# Patient Record
Sex: Male | Born: 1980 | Race: White | Hispanic: No | Smoking: Current every day smoker
Health system: Southern US, Community
[De-identification: ages and names within clinical notes are randomized; demographics above are authoritative.]

## PROBLEM LIST (undated history)

## (undated) DIAGNOSIS — G8929 Other chronic pain: Secondary | ICD-10-CM

---
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
## 1998-05-16 ENCOUNTER — Emergency Department (HOSPITAL_COMMUNITY): Admission: EM | Admit: 1998-05-16 | Discharge: 1998-05-16 | Payer: Self-pay | Admitting: Emergency Medicine

## 1998-12-15 ENCOUNTER — Emergency Department (HOSPITAL_COMMUNITY): Admission: EM | Admit: 1998-12-15 | Discharge: 1998-12-15 | Payer: Self-pay | Admitting: Emergency Medicine

## 2001-03-21 ENCOUNTER — Emergency Department (HOSPITAL_COMMUNITY): Admission: EM | Admit: 2001-03-21 | Discharge: 2001-03-21 | Payer: Self-pay | Admitting: Emergency Medicine

## 2002-10-16 ENCOUNTER — Emergency Department (HOSPITAL_COMMUNITY): Admission: EM | Admit: 2002-10-16 | Discharge: 2002-10-16 | Payer: Self-pay | Admitting: Emergency Medicine

## 2003-05-23 ENCOUNTER — Emergency Department (HOSPITAL_COMMUNITY): Admission: EM | Admit: 2003-05-23 | Discharge: 2003-05-23 | Payer: Self-pay | Admitting: Emergency Medicine

## 2006-09-15 ENCOUNTER — Emergency Department (HOSPITAL_COMMUNITY): Admission: EM | Admit: 2006-09-15 | Discharge: 2006-09-15 | Payer: Self-pay | Admitting: Emergency Medicine

## 2007-05-23 ENCOUNTER — Emergency Department (HOSPITAL_COMMUNITY): Admission: EM | Admit: 2007-05-23 | Discharge: 2007-05-23 | Payer: Self-pay | Admitting: Emergency Medicine

## 2007-07-25 ENCOUNTER — Emergency Department (HOSPITAL_COMMUNITY): Admission: EM | Admit: 2007-07-25 | Discharge: 2007-07-25 | Payer: Self-pay | Admitting: Emergency Medicine

## 2007-09-02 ENCOUNTER — Emergency Department (HOSPITAL_COMMUNITY): Admission: EM | Admit: 2007-09-02 | Discharge: 2007-09-02 | Payer: Self-pay | Admitting: Emergency Medicine

## 2008-07-04 ENCOUNTER — Emergency Department (HOSPITAL_COMMUNITY): Admission: EM | Admit: 2008-07-04 | Discharge: 2008-07-04 | Payer: Self-pay | Admitting: Emergency Medicine

## 2012-05-24 ENCOUNTER — Ambulatory Visit: Payer: BC Managed Care – PPO

## 2012-05-24 ENCOUNTER — Ambulatory Visit (INDEPENDENT_AMBULATORY_CARE_PROVIDER_SITE_OTHER): Payer: BC Managed Care – PPO | Admitting: Family Medicine

## 2012-05-24 VITALS — BP 120/76 | HR 98 | Temp 98.1°F | Resp 16 | Ht 66.75 in | Wt 160.6 lb

## 2012-05-24 DIAGNOSIS — G609 Hereditary and idiopathic neuropathy, unspecified: Secondary | ICD-10-CM

## 2012-05-24 LAB — POCT CBC
HCT, POC: 45.2 % (ref 43.5–53.7)
Lymph, poc: 3.4 (ref 0.6–3.4)
MCH, POC: 31.9 pg — AB (ref 27–31.2)
MCHC: 31.6 g/dL — AB (ref 31.8–35.4)
MCV: 100.9 fL — AB (ref 80–97)
MID (cbc): 0.6 (ref 0–0.9)
POC LYMPH PERCENT: 35.9 %L (ref 10–50)
RDW, POC: 13.4 %

## 2012-05-24 LAB — GLUCOSE, POCT (MANUAL RESULT ENTRY): POC Glucose: 85 mg/dl (ref 70–99)

## 2012-05-24 MED ORDER — HYDROCODONE-ACETAMINOPHEN 5-325 MG PO TABS: 1.0000 | ORAL_TABLET | Freq: Four times a day (QID) | ORAL | Status: DC | PRN

## 2012-05-24 MED ORDER — METHYLPREDNISOLONE 4 MG PO KIT: PACK | ORAL | Status: DC

## 2012-05-24 NOTE — Progress Notes (Signed)
Subjective:    Patient ID: Cody Sims, male    DOB: Dec 01, 1980, 32 y.o.   MRN: [id number] Chief Complaint  Patient presents with  . Neck Pain    arms painful  numbness and tingling in arms and hands symptoms x 1 year    HPI  For the last yr L>Rt goes numb and tinging whenever he sleeps - but now the pain is so bad it wakes him up from sleep and now happening during the day at work. Now Left 1-3rd finger numb.  At times L lower humerus and - will hit suddently and hurt so bad he can't breath and hurts for an hr or 2.  Took a wk off of work, taking tons of ibupfrofen but now happens every single night and now during the day.  During the day is mainly hin his left arm - occ during day right arm will feel numb and tinging but L is excruciatingly painful. 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  Works in [employer]  History reviewed. No pertinent past medical history. History reviewed. No pertinent past surgical history. No family history on file. No current outpatient prescriptions on file prior to visit.   No current facility-administered medications on file prior to visit.   No Known Allergies History   Social History  . Marital Status: Married    Spouse Name: N/A    Number of Children: N/A  . Years of Education: N/A   Social History Main Topics  . Smoking status: Current Every Day Smoker  . Smokeless tobacco: None  . Alcohol Use: None  . Drug Use: None  . Sexually Active: None   Other  Topics Concern  . None   Social History Narrative  . None      Review of Systems  Constitutional: Negative for fever and chills.  HENT: Positive for neck pain and neck stiffness.   Eyes: Positive for visual disturbance.  Gastrointestinal: Negative for abdominal pain, diarrhea and constipation.  Genitourinary: Negative for urgency, frequency, decreased urine volume and difficulty urinating.  Musculoskeletal: Positive for myalgias, back pain and arthralgias. Negative for joint swelling and gait problem.  Skin: Negative for color change and wound.  Neurological: Positive for weakness, numbness and headaches. Negative for dizziness and light-headedness.  Psychiatric/Behavioral: Positive for sleep disturbance.      BP 120/76  Pulse 98  Temp(Src) 98.1 F (36.7 C) (Oral)  Resp 16  Ht 5' 6.75" (1.695 m)  Wt 160 lb 9.6 oz (72.848 kg)  BMI 25.36 kg/m2  SpO2 99% Objective:   Physical Exam  Constitutional: He is oriented to person, place, and time. He appears well-developed and well-nourished. No distress.  HENT:  Head: Normocephalic and atraumatic.  Eyes: Conjunctivae are normal. Pupils are equal, round, and reactive to light. No scleral icterus.  Neck: Normal range of motion. Neck  supple. No thyromegaly present.  Cardiovascular: Normal rate, regular rhythm, normal heart sounds and intact distal pulses.   Pulmonary/Chest: Effort normal and breath sounds normal. No respiratory distress.  Musculoskeletal: He exhibits no edema.       Cervical back: He exhibits decreased range of motion, tenderness, bony tenderness, pain and spasm. He exhibits no edema, no laceration and normal pulse.       Right hand: Normal. Normal sensation noted. Normal strength noted. He exhibits no finger abduction, no thumb/finger opposition and no wrist extension trouble.       Left hand: Normal. Normal sensation noted. Normal strength noted. He exhibits no finger abduction, no thumb/finger opposition and no wrist  extension trouble.  Lymphadenopathy:    He has no cervical adenopathy.  Neurological: He is alert and oriented to person, place, and time.  Reflex Scores:      Tricep reflexes are 2+ on the right side and 2+ on the left side.      Bicep reflexes are 2+ on the right side and 2+ on the left side.      Brachioradialis reflexes are 2+ on the right side and 2+ on the left side. Skin: Skin is warm and dry. He is not diaphoretic.  Psychiatric: He has a normal mood and affect. His behavior is normal.      Results for orders placed in visit on 05/24/12  POCT CBC      Result Value Range   WBC 9.5  4.6 - 10.2 K/uL   Lymph, poc 3.4  0.6 - 3.4   POC LYMPH PERCENT 35.9  10 - 50 %L   MID (cbc) 0.6  0 - 0.9   POC MID % 6.8  0 - 12 %M   POC Granulocyte 5.4  2 - 6.9   Granulocyte percent 57.3  37 - 80 %G   RBC 4.48 (*) 4.69 - 6.13 M/uL   Hemoglobin 14.3  14.1 - 18.1 g/dL   HCT, POC 16.1  09.6 - 53.7 %   MCV 100.9 (*) 80 - 97 fL   MCH, POC 31.9 (*) 27 - 31.2 pg   MCHC 31.6 (*) 31.8 - 35.4 g/dL   RDW, POC 04.5     Platelet Count, POC 285  142 - 424 K/uL   MPV 10.3  0 - 99.8 fL  GLUCOSE, POCT (MANUAL RESULT ENTRY)      Result Value Range   POC Glucose 85  70 - 99 mg/dl   [facility] reading (PRIMARY) by  Dr. Clelia Croft. Loss of some of the normal cervical lordosis, some mild disc narrowing between C5-6.  Assessment & Plan:  peripheral neuropathy - Plan: C-reactive protein, DG Cervical Spine Complete, POCT CBC, TSH, RPR, Comprehensive metabolic panel, POCT glucose (manual entry), Vitamin B12, Sedimentation Rate, Will do testing to check for alternate causes of his neuropathy though likely coming from his neck since DDD - which is suprising in such a young person.  He will check into his family history further to see if there could be a genetic rheumatologic component.  Certainly he may be prone to cervical pathology due to his left eye vision only leading to permenent twisting of his neck to aid in vision which  is leading to chronic neck muscle spasms.  Will try prednisone taper to see if that helps the radiculopathy.  Neck pain, chronic  Muscle spasms of neck  Meds ordered this encounter  Medications  . ibuprofen (ADVIL,MOTRIN) 200 MG tablet    Sig:  Take 200 mg by mouth every 6 (six) hours as needed for pain.  . Multiple Vitamin (MULTIVITAMIN) tablet    Sig: Take 1 tablet by mouth daily.  . methylPREDNISolone (MEDROL, PAK,) 4 MG tablet    Sig: follow package directions    Dispense:  21 tablet    Refill:  0  . HYDROcodone-acetaminophen (NORCO/VICODIN) 5-325 MG per tablet    Sig: Take 1 tablet by mouth every 6 (six) hours as needed for pain.    Dispense:  30 tablet    Refill:  0

## 2012-05-25 LAB — COMPREHENSIVE METABOLIC PANEL
ALT: 32 U/L (ref 0–53)
BUN: 13 mg/dL (ref 6–23)
CO2: 28 mEq/L (ref 19–32)
Calcium: 9.9 mg/dL (ref 8.4–10.5)
Chloride: 103 mEq/L (ref 96–112)
Creat: 0.95 mg/dL (ref 0.50–1.35)
Glucose, Bld: 92 mg/dL (ref 70–99)

## 2012-05-25 LAB — SEDIMENTATION RATE: Sed Rate: 1 mm/hr (ref 0–16)

## 2012-05-25 LAB — VITAMIN B12: Vitamin B-12: 1106 pg/mL — ABNORMAL HIGH (ref 211–911)

## 2012-05-25 LAB — C-REACTIVE PROTEIN: CRP: <0.5 mg/dL (ref <0.60)

## 2012-05-30 ENCOUNTER — Telehealth: Payer: Self-pay

## 2012-05-30 MED ORDER — TRAMADOL HCL 50 MG PO TABS: 50.0000 mg | ORAL_TABLET | Freq: Three times a day (TID) | ORAL | Status: DC | PRN

## 2012-05-30 NOTE — Telephone Encounter (Signed)
Pt advised new med sent in

## 2012-05-30 NOTE — Telephone Encounter (Signed)
PT STATES DR Clelia Croft WANTED HIM TO COME BACK FOR A RECHECK ON 3/12 BUT SHE IS OFF THAT DAY. WOULD LIKE A CALL BACK AT [phone]

## 2012-05-30 NOTE — Telephone Encounter (Signed)
Yes - the 13th will be perfect thanks.  Please send in tramadol 50mg  1 tab po q6hrs prn pain. Disp 30 no refills. Thanks.

## 2012-05-30 NOTE — Telephone Encounter (Signed)
He can come in on the 13th to urgent care center to see him. He asked if we can change the hydrocodone. He is using 2 at a time and it makes him sick he is vomiting he also states he takes one and this also makes him sick.

## 2012-06-01 ENCOUNTER — Ambulatory Visit (INDEPENDENT_AMBULATORY_CARE_PROVIDER_SITE_OTHER): Payer: BC Managed Care – PPO | Admitting: Family Medicine

## 2012-06-01 ENCOUNTER — Encounter: Payer: Self-pay | Admitting: Family Medicine

## 2012-06-01 VITALS — BP 110/68 | HR 60 | Temp 98.1°F | Resp 16 | Ht 67.25 in | Wt 163.2 lb

## 2012-06-01 DIAGNOSIS — M436 Torticollis: Secondary | ICD-10-CM

## 2012-06-01 DIAGNOSIS — M5010 Cervical disc disorder with radiculopathy, unspecified cervical region: Secondary | ICD-10-CM

## 2012-06-01 MED ORDER — OXYCODONE-ACETAMINOPHEN 5-325 MG PO TABS: 1.0000 | ORAL_TABLET | Freq: Three times a day (TID) | ORAL | Status: DC | PRN

## 2012-06-01 MED ORDER — CYCLOBENZAPRINE HCL 10 MG PO TABS: 10.0000 mg | ORAL_TABLET | Freq: Three times a day (TID) | ORAL | Status: DC | PRN

## 2012-06-01 NOTE — Progress Notes (Signed)
Subjective:    Patient ID: Cody Sims, male    DOB: 1980/11/25, 32 y.o.   MRN: [id number] Chief Complaint  Patient presents with  . Follow-up   HPI   Cody Sims initially improved when he was on high doses of the prednisone taper.  The pain reduced to where he could sleep through the night at least.  But as the prednisone tapered down it came back and now his shooting burning arm pain is worse than ever before.  Initially it was mainly his left arm and hand that were burning and tingling but now right arm and hand is hurting equally as much as left hand and his right hand is starting to go numb. He is noticing weakness, has now started dropping things, difficult to open jars, doorknobs, etc  He does notice some neck pain and stiffness but pales in comparison to the burning and numbness in his arms and hands.  Mother had cervical vertebral fusion and father has lumbar fusion twice and hand surgery.  They are followed by rheumatology but doesn't know any diagnosis beyond degenerative joint disease.  No past medical history on file. Current Outpatient Prescriptions on File Prior to Visit  Medication Sig Dispense Refill  . ibuprofen (ADVIL,MOTRIN) 200 MG tablet Take 200 mg by mouth every 6 (six) hours as needed for pain.      . Multiple Vitamin (MULTIVITAMIN) tablet Take 1 tablet by mouth daily.       No current facility-administered medications on file prior to visit.   No Known Allergies   Review of Systems  Constitutional: Negative for fever and chills.  HENT: Positive for neck pain and neck stiffness.   Gastrointestinal: Negative for abdominal pain, diarrhea and constipation.  Genitourinary: Negative for urgency, frequency, decreased urine volume and difficulty urinating.  Musculoskeletal: Positive for myalgias and back pain. Negative for joint swelling, arthralgias and gait problem.  Skin: Negative for color change and wound.  Neurological: Positive for weakness, numbness and headaches.  Negative for dizziness, tremors and light-headedness.  Psychiatric/Behavioral: Positive for sleep disturbance.      BP 110/68  Pulse 60  Temp(Src) 98.1 F (36.7 C) (Oral)  Resp 16  Ht 5' 7.25" (1.708 m)  Wt 163 lb 3.2 oz (74.027 kg)  BMI 25.38 kg/m2  SpO2 99% Objective:   Physical Exam  Constitutional: He is oriented to person, place, and time. He appears well-developed and well-nourished. No distress.  HENT:  Head: Normocephalic and atraumatic.  Cardiovascular: Intact distal pulses.   Pulmonary/Chest: Effort normal.  Musculoskeletal: Normal range of motion. He exhibits tenderness. He exhibits no edema.       Cervical back: He exhibits tenderness, pain and spasm. He exhibits normal range of motion, no bony tenderness, no edema and normal pulse.       Thoracic back: He exhibits normal range of motion, no bony tenderness, no swelling and no deformity.  Negative straight leg raise bilaterally  Neurological: He is alert and oriented to person, place, and time. He has normal strength. He displays no atrophy. No sensory deficit. He exhibits normal muscle tone. Coordination and gait normal.  Reflex Scores:      Bicep reflexes are 1+ on the right side and 1+ on the left side.      Brachioradialis reflexes are 1+ on the right side and 1+ on the left side. Skin: Skin is warm and dry. No rash noted. He is not diaphoretic. No erythema.  Psychiatric: He has a normal mood and  affect. His behavior is normal.      Results for orders placed in visit on 05/24/12  C-REACTIVE PROTEIN      Result Value Range   CRP <0.5  <0.60 mg/dL  TSH      Result Value Range   TSH 1.248  0.350 - 4.500 uIU/mL  RPR      Result Value Range   RPR NON REAC  NON REAC  COMPREHENSIVE METABOLIC PANEL      Result Value Range   Sodium 137  135 - 145 mEq/L   Potassium 4.1  3.5 - 5.3 mEq/L   Chloride 103  96 - 112 mEq/L   CO2 28  19 - 32 mEq/L   Glucose, Bld 92  70 - 99 mg/dL   BUN 13  6 - 23 mg/dL   Creat 1.61   0.96 - 0.45 mg/dL   Total Bilirubin 0.5  0.3 - 1.2 mg/dL   Alkaline Phosphatase 71  39 - 117 U/L   AST 29  0 - 37 U/L   ALT 32  0 - 53 U/L   Total Protein 7.5  6.0 - 8.3 g/dL   Albumin 4.7  3.5 - 5.2 g/dL   Calcium 9.9  8.4 - 40.9 mg/dL  VITAMIN W11      Result Value Range   Vitamin B-12 1106 (*) 211 - 911 pg/mL  SEDIMENTATION RATE      Result Value Range   Sed Rate 1  0 - 16 mm/hr  POCT CBC      Result Value Range   WBC 9.5  4.6 - 10.2 K/uL   Lymph, poc 3.4  0.6 - 3.4   POC LYMPH PERCENT 35.9  10 - 50 %L   MID (cbc) 0.6  0 - 0.9   POC MID % 6.8  0 - 12 %M   POC Granulocyte 5.4  2 - 6.9   Granulocyte percent 57.3  37 - 80 %G   RBC 4.48 (*) 4.69 - 6.13 M/uL   Hemoglobin 14.3  14.1 - 18.1 g/dL   HCT, POC 91.4  78.2 - 53.7 %   MCV 100.9 (*) 80 - 97 fL   MCH, POC 31.9 (*) 27 - 31.2 pg   MCHC 31.6 (*) 31.8 - 35.4 g/dL   RDW, POC 95.6     Platelet Count, POC 285  142 - 424 K/uL   MPV 10.3  0 - 99.8 fL  GLUCOSE, POCT (MANUAL RESULT ENTRY)      Result Value Range   POC Glucose 85  70 - 99 mg/dl    Assessment & Plan:  Acute torticollis - There is no doubt that pt has severe spasming of his left cervical paraspinal muscles due to the fact that he is monocular with left eye vision only.  He will definitely need PT.  However, due to his abnormal xray and sxs of neurologic impingement, he definitely needs a cspine MRI prior to starting PT to ensure that he does not need c-spine surgery. His degenerative disc disease at C5-6 is surprising at such a young age - and seems to be rapidly worsening with his progressive sxs of bilateral nerve impingement.  Cervicalgia  Degeneration of cervical intervertebral disc  Cervical disc disorder with radiculopathy of cervical region - Plan: MR Cervical Spine Wo Contrast  Meds ordered this encounter  Medications  . cyclobenzaprine (FLEXERIL) 10 MG tablet    Sig: Take 1 tablet (10 mg total) by mouth 3 (three) times  daily as needed for muscle  spasms.    Dispense:  30 tablet    Refill:  0  . oxyCODONE-acetaminophen (ROXICET) 5-325 MG per tablet    Sig: Take 1 tablet by mouth every 8 (eight) hours as needed for pain.    Dispense:  20 tablet    Refill:  0   RTC after MRI to review results and decide upon plan.

## 2012-06-01 NOTE — Patient Instructions (Signed)
Torticollis, Acute You have suddenly (acutely) developed a twisted neck (torticollis). This is usually a self-limited condition. CAUSES  Acute torticollis may be caused by malposition, trauma or infection. Most commonly, acute torticollis is caused by sleeping in an awkward position. Torticollis may also be caused by the flexion, extension or twisting of the neck muscles beyond their normal position. Sometimes, the exact cause may not be known. SYMPTOMS  Usually, there is pain and limited movement of the neck. Your neck may twist to one side. DIAGNOSIS  The diagnosis is often made by physical examination. X-rays, CT scans or MRIs may be done if there is a history of trauma or concern of infection. TREATMENT  For a common, stiff neck that develops during sleep, treatment is focused on relaxing the contracted neck muscle. Medications (including shots) may be used to treat the problem. Most cases resolve in several days. Torticollis usually responds to conservative physical therapy. If left untreated, the shortened and spastic neck muscle can cause deformities in the face and neck. Rarely, surgery is required. HOME CARE INSTRUCTIONS   Use over-the-counter and prescription medications as directed by your caregiver.  Do stretching exercises and massage the neck as directed by your caregiver.  Follow up with physical therapy if needed and as directed by your caregiver. SEEK IMMEDIATE MEDICAL CARE IF:   You develop difficulty breathing or noisy breathing (stridor).  You drool, develop trouble swallowing or have pain with swallowing.  You develop numbness or weakness in the hands or feet.  You have changes in speech or vision.  You have problems with urination or bowel movements.  You have difficulty walking.  You have a fever.  You have increased pain. MAKE SURE YOU:   Understand these instructions.  Will watch your condition.  Will get help right away if you are not doing well or  get worse. Document Released: 03/05/2000 Document Revised: 05/31/2011 Document Reviewed: 04/16/2009 [facility] Patient Information 2013 [place], [place].  Improving your posture may help reduce symptoms. Posture improvement includes pulling your chin and abdomen in while sitting or standing. If you are sitting, sit in a firm chair with your buttocks against the back of the chair. While sleeping, try replacing your pillow with a small towel rolled to 2 inches in diameter, or use a cervical pillow or soft cervical collar. Poor sleeping positions delay healing.  For patients with nerve root damage, which causes numbness or weakness, the use of a cervical traction apparatus may be recommended. Surgery is rarely necessary for these injuries. However, cervical strain and sprains that are present at birth (congenital) may require surgery. MEDICATION   If pain medication is necessary, nonsteroidal anti-inflammatory medications, such as aspirin and ibuprofen, or other minor pain relievers, such as acetaminophen, are often recommended.  Do not take pain medication for 7 days before surgery.  Prescription pain relievers may be given if deemed necessary by your caregiver. Use only as directed and only as much as you need. HEAT AND COLD:   Cold treatment (icing) relieves pain and reduces inflammation. Cold treatment should be applied for 10 to 15 minutes every 2 to 3 hours for inflammation and pain and immediately after any activity that aggravates your symptoms. Use ice packs or an ice massage.  Heat treatment may be used prior to performing the stretching and strengthening activities prescribed by your caregiver, physical therapist, or athletic trainer. Use a heat pack or a warm soak. SEEK MEDICAL CARE IF:   Symptoms get worse or do not  improve in 2 weeks despite treatment.  New, unexplained symptoms develop (drugs used in treatment may produce side effects). EXERCISES RANGE OF MOTION (ROM) AND STRETCHING  EXERCISES - Cervical Strain and Sprain These exercises may help you when beginning to rehabilitate your injury. In order to successfully resolve your symptoms, you must improve your posture. These exercises are designed to help reduce the forward-head and rounded-shoulder posture which contributes to this condition. Your symptoms may resolve with or without further involvement from your physician, physical therapist or athletic trainer. While completing these exercises, remember:   Restoring tissue flexibility helps normal motion to return to the joints. This allows healthier, less painful movement and activity.  An effective stretch should be held for at least 20 seconds, although you may need to begin with shorter hold times for comfort.  A stretch should never be painful. You should only feel a gentle lengthening or release in the stretched tissue. STRETCH- Axial Extensors  Lie on your back on the floor. You may bend your knees for comfort. Place a rolled up hand towel or dish towel, about 2 inches in diameter, under the part of your head that makes contact with the floor.  Gently tuck your chin, as if trying to make a "double chin," until you feel a gentle stretch at the base of your head.  Hold __________ seconds. Repeat __________ times. Complete this exercise __________ times per day.  STRETECH - Axial Extension   Stand or sit on a firm surface. Assume a good posture: chest up, shoulders drawn back, abdominal muscles slightly tense, knees unlocked (if standing) and feet hip width apart.  Slowly retract your chin so your head slides back and your chin slightly lowers.Continue to look straight ahead.  You should feel a gentle stretch in the back of your head. Be certain not to feel an aggressive stretch since this can cause headaches later.  Hold for __________ seconds. Repeat __________ times. Complete this exercise __________ times per day. STRETCH  Cervical Side Bend   Stand or  sit on a firm surface. Assume a good posture: chest up, shoulders drawn back, abdominal muscles slightly tense, knees unlocked (if standing) and feet hip width apart.  Without letting your nose or shoulders move, slowly tip your right / left ear to your shoulder until your feel a gentle stretch in the muscles on the opposite side of your neck.  Hold __________ seconds. Repeat __________ times. Complete this exercise __________ times per day. STRETCH  Cervical Rotators   Stand or sit on a firm surface. Assume a good posture: chest up, shoulders drawn back, abdominal muscles slightly tense, knees unlocked (if standing) and feet hip width apart.  Keeping your eyes level with the ground, slowly turn your head until you feel a gentle stretch along the back and opposite side of your neck.  Hold __________ seconds. Repeat __________ times. Complete this exercise __________ times per day. RANGE OF MOTION - Neck Circles   Stand or sit on a firm surface. Assume a good posture: chest up, shoulders drawn back, abdominal muscles slightly tense, knees unlocked (if standing) and feet hip width apart.  Gently roll your head down and around from the back of one shoulder to the back of the other. The motion should never be forced or painful.  Repeat the motion 10-20 times, or until you feel the neck muscles relax and loosen. Repeat __________ times. Complete the exercise __________ times per day. STRENGTHENING EXERCISES - Cervical Strain and Sprain These  exercises may help you when beginning to rehabilitate your injury. They may resolve your symptoms with or without further involvement from your physician, physical therapist or athletic trainer. While completing these exercises, remember:   Muscles can gain both the endurance and the strength needed for everyday activities through controlled exercises.  Complete these exercises as instructed by your physician, physical therapist or athletic trainer.  Progress the resistance and repetitions only as guided.  You may experience muscle soreness or fatigue, but the pain or discomfort you are trying to eliminate should never worsen during these exercises. If this pain does worsen, stop and make certain you are following the directions exactly. If the pain is still present after adjustments, discontinue the exercise until you can discuss the trouble with your clinician. STRENGTH Cervical Flexors, Isometric  Face a wall, standing about 6 inches away. Place a small pillow, a ball about 6-8 inches in diameter, or a folded towel between your forehead and the wall.  Slightly tuck your chin and gently push your forehead into the soft object. Push only with mild to moderate intensity, building up tension gradually. Keep your jaw and forehead relaxed.  Hold 10 to 20 seconds. Keep your breathing relaxed.  Release the tension slowly. Relax your neck muscles completely before you start the next repetition. Repeat __________ times. Complete this exercise __________ times per day. STRENGTH- Cervical Lateral Flexors, Isometric   Stand about 6 inches away from a wall. Place a small pillow, a ball about 6-8 inches in diameter, or a folded towel between the side of your head and the wall.  Slightly tuck your chin and gently tilt your head into the soft object. Push only with mild to moderate intensity, building up tension gradually. Keep your jaw and forehead relaxed.  Hold 10 to 20 seconds. Keep your breathing relaxed.  Release the tension slowly. Relax your neck muscles completely before you start the next repetition. Repeat __________ times. Complete this exercise __________ times per day. STRENGTH  Cervical Extensors, Isometric   Stand about 6 inches away from a wall. Place a small pillow, a ball about 6-8 inches in diameter, or a folded towel between the back of your head and the wall.  Slightly tuck your chin and gently tilt your head back into the soft  object. Push only with mild to moderate intensity, building up tension gradually. Keep your jaw and forehead relaxed.  Hold 10 to 20 seconds. Keep your breathing relaxed.  Release the tension slowly. Relax your neck muscles completely before you start the next repetition. Repeat __________ times. Complete this exercise __________ times per day. POSTURE AND BODY MECHANICS CONSIDERATIONS - Cervical Strain and Sprain Keeping correct posture when sitting, standing or completing your activities will reduce the stress put on different body tissues, allowing injured tissues a chance to heal and limiting painful experiences. The following are general guidelines for improved posture. Your physician or physical therapist will provide you with any instructions specific to your needs. While reading these guidelines, remember:  The exercises prescribed by your provider will help you have the flexibility and strength to maintain correct postures.  The correct posture provides the optimal environment for your joints to work. All of your joints have less wear and tear when properly supported by a spine with good posture. This means you will experience a healthier, less painful body.  Correct posture must be practiced with all of your activities, especially prolonged sitting and standing. Correct posture is as important when doing repetitive  low-stress activities (typing) as it is when doing a single heavy-load activity (lifting). PROLONGED STANDING WHILE SLIGHTLY LEANING FORWARD When completing a task that requires you to lean forward while standing in one place for a long time, place either foot up on a stationary 2-4 inch high object to help maintain the best posture. When both feet are on the ground, the low back tends to lose its slight inward curve. If this curve flattens (or becomes too large), then the back and your other joints will experience too much stress, fatigue more quickly and can cause pain.    RESTING POSITIONS Consider which positions are most painful for you when choosing a resting position. If you have pain with flexion-based activities (sitting, bending, stooping, squatting), choose a position that allows you to rest in a less flexed posture. You would want to avoid curling into a fetal position on your side. If your pain worsens with extension-based activities (prolonged standing, working overhead), avoid resting in an extended position such as sleeping on your stomach. Most people will find more comfort when they rest with their spine in a more neutral position, neither too rounded nor too arched. Lying on a non-sagging bed on your side with a pillow between your knees, or on your back with a pillow under your knees will often provide some relief. Keep in mind, being in any one position for a prolonged period of time, no matter how correct your posture, can still lead to stiffness. WALKING Walk with an upright posture. Your ears, shoulders and hips should all line-up. OFFICE WORK When working at a desk, create an environment that supports good, upright posture. Without extra support, muscles fatigue and lead to excessive strain on joints and other tissues. CHAIR:  A chair should be able to slide under your desk when your back makes contact with the back of the chair. This allows you to work closely.  The chair's height should allow your eyes to be level with the upper part of your monitor and your hands to be slightly lower than your elbows.  Body position:  Your feet should make contact with the floor. If this is not possible, use a foot rest.  Keep your ears over your shoulders. This will reduce stress on your neck and low back. Document Released: 03/08/2005 Document Revised: 05/31/2011 Document Reviewed: 06/20/2008 [facility] Patient Information 2013 [place], [place].

## 2012-06-05 ENCOUNTER — Encounter: Payer: Self-pay | Admitting: Family Medicine

## 2012-06-05 NOTE — Telephone Encounter (Signed)
Can you check on this pt referral for MRI

## 2012-06-06 ENCOUNTER — Telehealth: Payer: Self-pay | Admitting: Radiology

## 2012-06-06 ENCOUNTER — Telehealth: Payer: Self-pay

## 2012-06-06 DIAGNOSIS — M542 Cervicalgia: Secondary | ICD-10-CM

## 2012-06-06 NOTE — Telephone Encounter (Signed)
Called to get prior auth of MRI C spine Lanora Manis has done peer to peer

## 2012-06-06 NOTE — Telephone Encounter (Signed)
[employer] has done review and will not authorize the MRI C spine because office visit note is not complete there is no objective or physical exam.

## 2012-06-06 NOTE — Telephone Encounter (Signed)
PATIENT IS REQUESTING A REFILL ON HIS OXYCODONE. HE IS STILL WAITING TO BE CLEARED BY HIS INSURANCE TO SEE A SPECIALIST FOR HIS NECK. HE SAW DR. SHAW. BEST PHONE [phone] (CELL)   PHARMACY CHOICE IS [facility] ON [address].     [facility]

## 2012-06-08 ENCOUNTER — Other Ambulatory Visit: Payer: Self-pay | Admitting: Family Medicine

## 2012-06-08 MED ORDER — OXYCODONE-ACETAMINOPHEN 5-325 MG PO TABS: 1.0000 | ORAL_TABLET | Freq: Three times a day (TID) | ORAL | Status: DC | PRN

## 2012-06-08 NOTE — Telephone Encounter (Signed)
Sorry. Note done now.  Can order be resubmitted now or do I need to do a peer-to-peer again?  If so, will be happy to do before 4 today.

## 2012-06-08 NOTE — Telephone Encounter (Signed)
rx printed - hold for sig. Thanks.

## 2012-06-08 NOTE — Telephone Encounter (Signed)
MRI C spine reordered- [facility]. Oxycodone printed at [facility]. Notify pt when signed.

## 2012-06-08 NOTE — Telephone Encounter (Signed)
He is also advised we will send MRI back through insurance for auth.

## 2012-06-08 NOTE — Telephone Encounter (Signed)
Called patient to advise  °

## 2012-06-08 NOTE — Addendum Note (Signed)
Addended by: Elease Etienne A on: 06/08/2012 02:28 PM   Modules accepted: Orders

## 2012-06-08 NOTE — Telephone Encounter (Signed)
Yes, that is fine. He can pick up the rx tomorrow.  This is only a short term med and can quickly become tolerant so try to use sparingly.  We can repeat the prednisone burst if he need to.

## 2012-06-14 ENCOUNTER — Other Ambulatory Visit: Payer: Self-pay | Admitting: Family Medicine

## 2012-06-14 ENCOUNTER — Telehealth: Payer: Self-pay | Admitting: Radiology

## 2012-06-14 DIAGNOSIS — Z9889 Other specified postprocedural states: Secondary | ICD-10-CM

## 2012-06-14 NOTE — Telephone Encounter (Signed)
Orbits ordered to r/o metal prior to MRI Scan

## 2012-06-17 ENCOUNTER — Other Ambulatory Visit: Payer: Self-pay

## 2012-06-19 ENCOUNTER — Telehealth: Payer: Self-pay

## 2012-06-19 MED ORDER — OXYCODONE-ACETAMINOPHEN 5-325 MG PO TABS: 1.0000 | ORAL_TABLET | Freq: Three times a day (TID) | ORAL | Status: DC | PRN

## 2012-06-19 NOTE — Telephone Encounter (Signed)
Patient is out of his medications and does not have mri until tomorrow he would like dr Clelia Croft to call him to see if he can get refills on his pain meds until the results come in from his mri please call him at [phone]

## 2012-06-19 NOTE — Telephone Encounter (Signed)
Please advise, it did take a long time to get precert from [employer]< but I finally did get this the end of last week. Pended refill on Oxycodone.

## 2012-06-19 NOTE — Telephone Encounter (Signed)
Yes, that is fine to refill for 30 tabs.

## 2012-06-19 NOTE — Telephone Encounter (Signed)
Patient advised it is ready for pick up 

## 2012-06-20 ENCOUNTER — Ambulatory Visit
Admission: RE | Admit: 2012-06-20 | Discharge: 2012-06-20 | Disposition: A | Discharge status: Home / Self Care | Payer: BC Managed Care – PPO | Source: Ambulatory Visit | Attending: Family Medicine | Admitting: Family Medicine

## 2012-06-20 DIAGNOSIS — M542 Cervicalgia: Secondary | ICD-10-CM

## 2012-06-20 DIAGNOSIS — Z9889 Other specified postprocedural states: Secondary | ICD-10-CM

## 2012-06-21 ENCOUNTER — Other Ambulatory Visit: Payer: Self-pay | Admitting: Family Medicine

## 2012-06-21 ENCOUNTER — Telehealth: Payer: Self-pay | Admitting: Physician Assistant

## 2012-06-21 DIAGNOSIS — M5030 Other cervical disc degeneration, unspecified cervical region: Secondary | ICD-10-CM

## 2012-06-21 DIAGNOSIS — M4712 Other spondylosis with myelopathy, cervical region: Secondary | ICD-10-CM

## 2012-06-21 MED ORDER — OXYCODONE HCL 5 MG PO TABS: 5.0000 mg | ORAL_TABLET | ORAL | Status: DC | PRN

## 2012-06-21 MED ORDER — PREDNISONE 20 MG PO TABS: ORAL_TABLET | ORAL | Status: DC

## 2012-06-21 NOTE — Progress Notes (Signed)
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  He is getting worse. Is having to take 3 tabs of the percocet at a time to get any relief.  Would like to repeat the steroid as it did help at the higher doses and I will refill his oxycodone but w/o the tylenol per his request.  Urgent referral to neurosurgery as this will likely need decompression and he is worsening.

## 2012-06-21 NOTE — Telephone Encounter (Signed)
Per Dr. Clelia Croft, ok to re-print rx as it did not print earlier.  Pt here for rx pick up

## 2012-06-28 ENCOUNTER — Encounter: Payer: Self-pay | Admitting: Family Medicine

## 2012-07-03 ENCOUNTER — Telehealth: Payer: Self-pay

## 2012-07-03 DIAGNOSIS — M4802 Spinal stenosis, cervical region: Secondary | ICD-10-CM

## 2012-07-03 NOTE — Telephone Encounter (Signed)
Patient called back to see if Oxycodone refilled. Explained message sent for approval. He is requesting response today. Please advise. See previous message

## 2012-07-03 NOTE — Telephone Encounter (Signed)
Called patient. He states he has seen neurosurgeon had nerve study. 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 Dr Phoebe Perch at [facility] will be on vacation until the end of the month. He is asking for referral to [facility], to another Neurosurgeon. He states he did not like Dr Phoebe Perch at all, and is unhappy about what he has read on the internet. I advised him we will let him know if we get the results from his nerve study. He also would like to know if we can renew his Oxycodone.

## 2012-07-03 NOTE — Telephone Encounter (Signed)
Pt is calling back to see if his medication is ready. I checked in pick up drawer and did not see it.

## 2012-07-03 NOTE — Telephone Encounter (Signed)
Pt would like dr Clelia Croft to call him regarding his medications please call at [phone]

## 2012-07-04 ENCOUNTER — Other Ambulatory Visit: Payer: Self-pay | Admitting: Family Medicine

## 2012-07-04 MED ORDER — OXYCODONE HCL 5 MG PO TABS: 5.0000 mg | ORAL_TABLET | ORAL | Status: DC | PRN

## 2012-07-04 NOTE — Telephone Encounter (Signed)
Where did this print? Can not find.

## 2012-07-04 NOTE — Telephone Encounter (Signed)
Med refilled and ready for pt's pick up.

## 2012-07-04 NOTE — Telephone Encounter (Signed)
Please remind pt that oxycodone is a highly controlled medication, no one else can approve it other than me, and if I am not in clinic for several days or busy seeing pt's it could take 48-72 hrs as normally this medication is ONLY refilled at [facility].  We will have to wean pt off soon before he becomes dependent so hopefully he can get into [facility] soon. Thank you for placing that referral and I will look for the notes from [place].

## 2012-07-04 NOTE — Telephone Encounter (Signed)
You are to fast for me!

## 2012-07-04 NOTE — Telephone Encounter (Signed)
Left message notified patient rx ready for p/u.

## 2012-07-04 NOTE — Telephone Encounter (Signed)
3rd call

## 2012-07-14 ENCOUNTER — Telehealth: Payer: Self-pay

## 2012-07-14 NOTE — Telephone Encounter (Signed)
Pt is wanting Dr. Clelia Croft to give him a call Call back number is [phone]

## 2012-07-14 NOTE — Telephone Encounter (Signed)
Patient calling for nerve study results and if Dr. Clelia Croft can refill his medication- Oxycodone.   [facility] [place]. Phone: [phone].

## 2012-07-14 NOTE — Telephone Encounter (Signed)
Called patient, he is asking if you have gotten results of nerve study he had done at neurosurgeon office, was told he needs surgery for nerves on his hand. He states the new pain meds are helping he states he needs refill next week. Wants to pick up today.

## 2012-07-14 NOTE — Telephone Encounter (Signed)
Pt is calling back to speak to Amy, Call back number is [phone]

## 2012-07-15 ENCOUNTER — Other Ambulatory Visit: Payer: Self-pay | Admitting: Family Medicine

## 2012-07-15 MED ORDER — OXYCODONE HCL 5 MG PO TABS: 5.0000 mg | ORAL_TABLET | ORAL | Status: DC | PRN

## 2012-07-15 NOTE — Telephone Encounter (Signed)
No, I have not seen the results yet and might not even know how to interpret them beyond reading the summary that the neurosurgeon wrote.  If they recommend surgery, then this is what he needs.  I had gave him a 2 wk supply of pain meds at last time, so I will refill and he can pick it up today but can't fill till Tues.  As we discussed in the past, the pain meds are going to become less effective the longer he is on them so needs to get sched w/ neurosurg asap.  Does have have an appt at [facility] yet?

## 2012-07-15 NOTE — Telephone Encounter (Signed)
Patient notified and voiced understanding. He does not have appt at [facility] yet. Can you print Oxycodone RX for patient to pick up?

## 2012-07-27 ENCOUNTER — Telehealth: Payer: Self-pay

## 2012-07-27 NOTE — Telephone Encounter (Signed)
PATIENT NEEDS TO PICK UP A COPY OF HIS X-RAYS TO TAKE OVER TO HIS NEUROLOGIST. PATIENT ALSO WANTS TO PICK UP AN OXYCODONE RX. [phone].

## 2012-07-27 NOTE — Telephone Encounter (Signed)
Request for xrays to xray/ Oxycodone request to Dr Clelia Croft

## 2012-07-28 ENCOUNTER — Other Ambulatory Visit: Payer: Self-pay | Admitting: Family Medicine

## 2012-07-28 ENCOUNTER — Telehealth: Payer: Self-pay

## 2012-07-28 NOTE — Telephone Encounter (Signed)
I saw he was requesting xrays?  Does this mean he finally has his neurosurg second opinion appt at [facility]?  I finally got the info from [facility] and I want to ensure he is able to get surgery before he has permanent nerve damage.  Can pick up oxycodone rx tomorrow but pt reminded to ensure he is only using ONE pharmacy to fill all pain med scripts.  Also, if he is thinking he is going to be on this medication for much longer, he should sched a OV w/ me at 104 so we can discuss and work out a more convenient and standardized prescribing as he is clearly needing more and more pain medicine.  He needs to give me at least 72 hrs for controlled med refills.

## 2012-07-28 NOTE — Telephone Encounter (Signed)
Patient calling again for Rx refill oxycodone 5 mg from Dr.Shaw.    To pickup Rx if possible.   Call:  [phone]

## 2012-07-29 MED ORDER — OXYCODONE HCL 5 MG PO TABS: 5.0000 mg | ORAL_TABLET | Freq: Four times a day (QID) | ORAL | Status: DC | PRN

## 2012-07-29 NOTE — Telephone Encounter (Signed)
Patient notified and voiced understanding. He does have an appt 08/16/12. He is going to call and schedule appt with Dr. Clelia Croft

## 2012-08-11 ENCOUNTER — Ambulatory Visit (INDEPENDENT_AMBULATORY_CARE_PROVIDER_SITE_OTHER): Payer: BC Managed Care – PPO | Admitting: Family Medicine

## 2012-08-11 ENCOUNTER — Encounter: Payer: Self-pay | Admitting: Family Medicine

## 2012-08-11 VITALS — BP 118/62 | HR 95 | Temp 98.4°F | Resp 16 | Ht 67.0 in | Wt 148.8 lb

## 2012-08-11 DIAGNOSIS — M5030 Other cervical disc degeneration, unspecified cervical region: Secondary | ICD-10-CM

## 2012-08-11 DIAGNOSIS — G894 Chronic pain syndrome: Secondary | ICD-10-CM | POA: Insufficient documentation

## 2012-08-11 DIAGNOSIS — G5600 Carpal tunnel syndrome, unspecified upper limb: Secondary | ICD-10-CM | POA: Insufficient documentation

## 2012-08-11 DIAGNOSIS — Z79899 Other long term (current) drug therapy: Secondary | ICD-10-CM | POA: Insufficient documentation

## 2012-08-11 DIAGNOSIS — G5602 Carpal tunnel syndrome, left upper limb: Secondary | ICD-10-CM

## 2012-08-11 MED ORDER — DULOXETINE HCL 30 MG PO CPEP: 30.0000 mg | ORAL_CAPSULE | Freq: Two times a day (BID) | ORAL | Status: DC

## 2012-08-11 MED ORDER — DICLOFENAC SODIUM 75 MG PO TBEC: 75.0000 mg | DELAYED_RELEASE_TABLET | Freq: Three times a day (TID) | ORAL | Status: DC

## 2012-08-11 MED ORDER — PREDNISONE 20 MG PO TABS: ORAL_TABLET | ORAL | Status: DC

## 2012-08-11 MED ORDER — DULOXETINE HCL 60 MG PO CPEP: 60.0000 mg | ORAL_CAPSULE | Freq: Every day | ORAL | Status: DC

## 2012-08-11 MED ORDER — GABAPENTIN 300 MG PO CAPS: 300.0000 mg | ORAL_CAPSULE | Freq: Two times a day (BID) | ORAL | Status: DC

## 2012-08-11 MED ORDER — OXYCODONE HCL 10 MG PO TABS: 10.0000 mg | ORAL_TABLET | Freq: Three times a day (TID) | ORAL | Status: DC | PRN

## 2012-08-11 MED ORDER — OXYCODONE HCL 10 MG PO TABS: 10.0000 mg | ORAL_TABLET | Freq: Three times a day (TID) | ORAL | Status: AC | PRN

## 2012-08-11 NOTE — Progress Notes (Signed)
Subjective:    Patient ID: Cody Sims, male    DOB: [birth year], 32 y.o.   MRN: [id number] Chief Complaint  Patient presents with  . Medication Refill  . RESULTS ON HANDS - NEUROSURGEON    HPI    [name] is arranged for a second opinion from the [facility] appt in [place].  He was reluctant to stick w/ [facility] as he did not see good things on the web and then his appt time got pushed back and no one told him so had a 2 hr wait. Was set up for the emg/ncv the next d and then never got the results and he was told that he wouldn't hear about them for a month as the neurosurgeon was on vacation for 3 wks - after told him it needed to be f/u asap.  Just scared about the different things he had heard.  Mother had anterior cervical fusion, his family is on chronic pain medication. Oxycodone 5mg  works but only if he takes 2-3 at once - about twice a day - still waking up during the night in pain. Still has flexeril but doesn't help - can help sleep at night more Prednisone worked but only for a few days when on the highest dose. Has some diclofenac which helps if he takes with oxycodone. Was thinking about starting on an antidpressant.  Used to be on prozac and alprazolam.  Family has been on cymbalta and he is interested in trying that.  Is often down because he hurts so much.   Noticing weakness in hands - esp left.  No past medical history on file. Current Outpatient Prescriptions on File Prior to Visit  Medication Sig Dispense Refill  . Multiple Vitamin (MULTIVITAMIN) tablet Take 1 tablet by mouth daily.       No current facility-administered medications on file prior to visit.   No Known Allergies No past surgical history on file. No family history on file. History   Social History  . Marital Status: Married    Spouse Name: N/A    Number of Children: N/A  . Years of Education: N/A   Social History Main Topics  . Smoking status: Current Every Day Smoker  . Smokeless tobacco:  None  . Alcohol Use: None  . Drug Use: None  . Sexually Active: None   Other Topics Concern  . None   Social History Narrative  . None      Review of Systems  Constitutional: Negative for fever and chills.  Gastrointestinal: Negative for abdominal pain, diarrhea and constipation.  Genitourinary: Negative for urgency, frequency, decreased urine volume and difficulty urinating.  Musculoskeletal: Positive for myalgias, back pain and arthralgias. Negative for joint swelling and gait problem.  Skin: Negative for color change and wound.  Neurological: Positive for facial asymmetry, weakness, numbness and headaches. Negative for dizziness and light-headedness.  Psychiatric/Behavioral: Positive for sleep disturbance.      BP 118/62  Pulse 95  Temp(Src) 98.4 F (36.9 C) (Oral)  Resp 16  Ht 5\' 7"  (1.702 m)  Wt 148 lb 12.8 oz (67.495 kg)  BMI 23.3 kg/m2  SpO2 99% Objective:   Physical Exam  Constitutional: He is oriented to person, place, and time. He appears well-developed and well-nourished. No distress.  HENT:  Head: Normocephalic and atraumatic.  Eyes: Conjunctivae are normal. Pupils are equal, round, and reactive to light. No scleral icterus.  Neck: Normal range of motion. Neck supple. No thyromegaly present.  Cardiovascular: Normal rate, regular rhythm,  normal heart sounds and intact distal pulses.   Pulmonary/Chest: Effort normal and breath sounds normal. No respiratory distress.  Musculoskeletal: He exhibits no edema.  Lymphadenopathy:    He has no cervical adenopathy.  Neurological: He is alert and oriented to person, place, and time.  Skin: Skin is warm and dry. He is not diaphoretic.  Psychiatric: He has a normal mood and affect. His behavior is normal.          Assessment & Plan:  Chronic pain syndrome - spent >25 min discussing chronic pain, it's management, opoid use, sequelae, and likely course with pt and his wife.  Since pt does not yet have surgery sched,  will start him on chronic narcotic protocol and reviewed [facility] policy - gave 2 rx for oxycodone 10mg  tid #90 so no refilled needed until 10/09/12 and he is not to receive narcotic rx from any one else.  He has appt sched w/ me on 718 for further refills as long as UDS is done at that time and [place] [facility] review is appropriate. Pt understands and is agreeable to plan.  Start cymbalta 30 mg qd x 1 wk then increase to 60 qd as long as tolerating, cont diclofenac. Start neurontin 300 qhs and increase to bid in 2 wks.  DDD (degenerative disc disease), cervical - further eval w/ neurosurgery after CTS repair. Pt's sxs did sig improve on high dose prednisone so gave printed snap rx for prednisone taper to fill in case his pain worsens.  Carpal tunnel syndrome, left - Followup with 2nd opinion neurosurg sched - reviewed w/ pt that surgery will be to spare function and cannot expect to have all pain relieved - but hopefully enough that he can go off narcotics.  Encounter for long-term (current) use of other medications  Meds ordered this encounter  Medications  . DISCONTD: oxyCODONE (OXY IR/ROXICODONE) 5 MG immediate release tablet    Sig: Take 5 mg by mouth every 6 (six) hours as needed for pain. Needs OV before refills.  NEED REFILLS  . oxyCODONE 10 MG TABS    Sig: Take 1 tablet (10 mg total) by mouth every 8 (eight) hours as needed for pain.    Dispense:  90 tablet    Refill:  0  . Oxycodone HCl 10 MG TABS    Sig: Take 1 tablet (10 mg total) by mouth every 8 (eight) hours as needed. May fill on or after 08/09/12    Dispense:  90 tablet    Refill:  0  . gabapentin (NEURONTIN) 300 MG capsule    Sig: Take 1 capsule (300 mg total) by mouth 2 (two) times daily.    Dispense:  60 capsule    Refill:  1  . DULoxetine (CYMBALTA) 30 MG capsule    Sig: Take 1 capsule (30 mg total) by mouth 2 (two) times daily.    Dispense:  60 capsule    Refill:  0  . DULoxetine (CYMBALTA) 60 MG capsule    Sig: Take 1 capsule (60  mg total) by mouth daily.    Dispense:  30 capsule    Refill:  0  . predniSONE (DELTASONE) 20 MG tablet    Sig: Take 4 tabs daily x 4d, then 3 tabs daily x 4d, then 2 tabs daily x4d, then 1 tabs daily x 4d, then 1/2 tab daily x 4d.    Dispense:  42 tablet    Refill:  0  . diclofenac (VOLTAREN) 75 MG EC tablet  Sig: Take 1 tablet (75 mg total) by mouth 3 (three) times daily.    Dispense:  90 tablet    Refill:  1

## 2012-08-11 NOTE — Patient Instructions (Addendum)
Start on cymbalta 30mg  every morning. If you are doing ok on it in 1 week, go up to 2 pills in the morning. The next month, fill the prescription for the 60mg  tabs instead so that you are just taking 1 every morning.  If it is making you fatigued and you need to switch to evening dosing, that is fine but try it in the morning first as hopefully it will help more with pain and energy. If you are having side effects (nausea, lightheadedness) and need to continue on the 30mg  tab for several weeks before going up to 60mg  - that is completely fine - listen to your body.  Take the gabapentin at night only for 2 weeks. If after 2 weeks you are doing ok (not to much hang over grogginess in the morning) then go up to twice a day on it.  Take the diclofenac three times a day with the oxycodone as needed (no more than 3 times a day.) Do NOT taking any other otc pain medications other than tylenol or acetaminophen (no ibuprofen, aleve, advil, goody's, Excedrin, etc)  [facility] Policy for Prescribing Controlled Substances (Revised 01/2012) 1. Prescriptions for controlled substances will be filled by ONE provider at [facility] with whom you have established and developed a plan for your care, including follow-up. 2. You are encouraged to schedule an appointment with your prescriber at our appointment center for follow-up visits whenever possible. 3. If you request a prescription for the controlled substance while at [facility] for an acute problem (with someone other than your regular prescriber), you MAY be given a ONE-TIME prescription for a 30-day supply of the controlled substance, to allow time for you to return to see your regular prescriber for additional prescriptions. 4. Follow up with me on July 18th - schedule an appointment, for additional med refills. We will do a urine drug screen at that time. If something happens and you are in more pain, try the prednisone taper. If for some reason you have to get pain mdicine from a  different provider, make sure you call and let me know why and who before you fill it.

## 2012-08-16 ENCOUNTER — Encounter: Payer: Self-pay | Admitting: Family Medicine

## 2012-09-11 ENCOUNTER — Telehealth: Payer: Self-pay

## 2012-09-11 NOTE — Telephone Encounter (Signed)
PT STATES HE NEED AN OOW NOTE FROM DR Clelia Croft FOR [phone] AND THE 19TH. HIS JOB WAS HECTIC. PLEASE CALL [phone] WHEN READY FOR PICK UP

## 2012-09-12 NOTE — Telephone Encounter (Signed)
PT IS EXTREMELY UPSET THAT THIS IS HIS THIRD CALL HERE. PT NEEDS SOMEONE TO CALL HIM TODAY, HE NEEDS A NOTE TO RETURN TO WORK.  PLEASE CALL PT TO ADVISE

## 2012-09-12 NOTE — Telephone Encounter (Signed)
PT CALLED AGAIN AND STATED HE CANNOT GO BACK TO WORK WITHOUT A DR'S NOTE SO IS REALLY IMPORTANT HE HEAR SOMETHING TODAY IF POSSIBLE PLEASE CALL [phone]

## 2012-09-13 ENCOUNTER — Telehealth: Payer: Self-pay

## 2012-09-13 NOTE — Telephone Encounter (Signed)
Left message for him to call me back so I can advise.

## 2012-09-13 NOTE — Telephone Encounter (Signed)
He is advised. 

## 2012-09-13 NOTE — Telephone Encounter (Signed)
Called again to different number he has provided

## 2012-09-13 NOTE — Telephone Encounter (Signed)
He has not been seen for any illness since 5/23. We cannot write him out of work for anything that we have not evaluated him for.

## 2012-09-13 NOTE — Telephone Encounter (Signed)
Amy please call patient back at [phone]

## 2012-09-28 ENCOUNTER — Other Ambulatory Visit: Payer: Self-pay | Admitting: Family Medicine

## 2012-09-28 ENCOUNTER — Telehealth: Payer: Self-pay

## 2012-09-28 DIAGNOSIS — G5600 Carpal tunnel syndrome, unspecified upper limb: Secondary | ICD-10-CM

## 2012-09-28 DIAGNOSIS — G894 Chronic pain syndrome: Secondary | ICD-10-CM

## 2012-09-28 DIAGNOSIS — M5030 Other cervical disc degeneration, unspecified cervical region: Secondary | ICD-10-CM

## 2012-09-28 NOTE — Telephone Encounter (Signed)
Pt calling for status on his earlier message. Will see if clinical can speak with pt. bf

## 2012-09-28 NOTE — Telephone Encounter (Signed)
Pt was given a 2 month supply of oxycodone on 5/23 so it should last him until 7/21 at least.  If he is out 2 weeks early - he is using MUCH more than prescribed and I cannot safely prescribe such high doses of medication.  Therefore, it would be best to refer him to a pain management doctor who can work with him to get his pain under better control and is in a safer environment to use these high levels of medication.  Referral to pain management placed.  In the meantime, he should try the rx for prednisone that he was given at our last visit and he is likely going to have w/d sxs from the narcotics.

## 2012-09-28 NOTE — Telephone Encounter (Signed)
Pt would like a refill on oxycodone. He states that he is completely out. Best# [phone]

## 2012-09-28 NOTE — Telephone Encounter (Signed)
Pt states that he has been taking oxycodone every 4-6 hours.  I advised him he was to be taking it only 3 times a day.  I explained note in regards to pain management and taking prednisone but he stated he will come in to be seen on Saturday.  Just FYI.

## 2012-09-30 NOTE — Telephone Encounter (Signed)
Pt ask that Dr Clelia Croft give him a call back @ [phone] to discuss meds

## 2012-10-01 ENCOUNTER — Other Ambulatory Visit: Payer: Self-pay | Admitting: Family Medicine

## 2012-10-01 DIAGNOSIS — G894 Chronic pain syndrome: Secondary | ICD-10-CM

## 2012-10-01 MED ORDER — OXYCODONE HCL 10 MG PO TABS: 10.0000 mg | ORAL_TABLET | Freq: Three times a day (TID) | ORAL | Status: DC

## 2012-10-01 NOTE — Telephone Encounter (Signed)
Patient came in office. Discussed with Dr. Clelia Croft and he needs to get appt with pain management and she will continue writing RX until appt. She has discussed in detail controlled substance policy and he has to take medication as directed. seh will give 7 day supply and follow up with her. Discussed with patient and he voiced understanding. He stated he has appt with Dr. Clelia Croft on Friday.

## 2012-10-06 ENCOUNTER — Ambulatory Visit: Payer: BC Managed Care – PPO | Admitting: Family Medicine

## 2012-10-06 ENCOUNTER — Telehealth: Payer: Self-pay

## 2012-10-06 NOTE — Telephone Encounter (Signed)
Pt requests refill on oxycodone  bf

## 2012-10-06 NOTE — Telephone Encounter (Signed)
Pt asks if his brother Amron Guerrette can pick it up because he is working in another city today.  Advised he will have to show id when picking up rx. bf

## 2012-10-07 ENCOUNTER — Telehealth: Payer: Self-pay

## 2012-10-07 NOTE — Telephone Encounter (Signed)
Pt called again about his refill  °

## 2012-10-07 NOTE — Telephone Encounter (Signed)
Patient calling to get a refill on Oxycodone. I told him it is still being processed and that we will notify him of pick up. Patient is letting our office know again that his brother Verdon Cummins will be picking it up. Patient is currently in [place].   Best number: [phone]

## 2012-10-08 ENCOUNTER — Telehealth: Payer: Self-pay | Admitting: *Deleted

## 2012-10-08 NOTE — Telephone Encounter (Signed)
Pt called again in regards refill again because he is going out of town to [facility] for work and need med and that's why he called 2 days ahead.

## 2012-10-08 NOTE — Telephone Encounter (Signed)
Can you please check the status on pain clinic referral

## 2012-10-09 ENCOUNTER — Telehealth: Payer: Self-pay

## 2012-10-09 ENCOUNTER — Other Ambulatory Visit: Payer: Self-pay | Admitting: Family Medicine

## 2012-10-09 MED ORDER — OXYCODONE HCL 10 MG PO TABS: 10.0000 mg | ORAL_TABLET | Freq: Two times a day (BID) | ORAL | Status: DC

## 2012-10-09 NOTE — Telephone Encounter (Signed)
Unsure if below note was routed.

## 2012-10-09 NOTE — Telephone Encounter (Signed)
Patient calling again for refill and wants to know if it  Is possible to get a two week rx instead of one week, due to our office not being able to respond fast enough to process his request. Patient see's Dr. Clelia Croft.  [phone]

## 2012-10-09 NOTE — Telephone Encounter (Signed)
Left message to return call. See previous note

## 2012-10-09 NOTE — Telephone Encounter (Signed)
Left message to return call 

## 2012-10-09 NOTE — Telephone Encounter (Signed)
Patient is calling again about picking up his medication... [phone]

## 2012-10-09 NOTE — Telephone Encounter (Signed)
Spoke with patient and he stated he must have misunderstood. He thought Dr. Clelia Croft was going to cover him until pain management appt and that he did not need to see her. i did go back over with him that he has to follow up with her as well until his appt. There was referral put in on 7/11. I did explain it is not uncommon for it to take this long. I will call in the am to check status of ref. He can also call them as well or try other pain clinics to see if he can get into one sooner. If so we can always send ref to them. Spoke with Dr. Clelia Croft and she Will give 2 week supply taking 1 every 12 hours to wean down. Has to have appt with Dr. Clelia Croft for any further refills.

## 2012-10-09 NOTE — Telephone Encounter (Signed)
Cody Sims at 10/01/2012 4:21 PM   Status: Signed            Patient came in office. Discussed with Dr. Clelia Croft and he needs to get appt with pain management and she will continue writing RX until appt. She has discussed in detail controlled substance policy and he has to take medication as directed. seh will give 7 day supply and follow up with her. Discussed with patient and he voiced understanding. He stated he has appt with Dr. Clelia Croft on Friday.   Pt was using more than rx'ed which is why he needs to transition care to a pain clinic. On 7/13 he was given a 1 wk refill with the understanding that he had to make an appt with pain management during the week and then follow-up with me at his scheduled appt on Friday so we could discuss continuing him on medication until that appt.  He was referred to [facility] - does he have an appt yet?  Pt did not show to his sched appt on Fri so no refills at this time.

## 2012-10-09 NOTE — Telephone Encounter (Signed)
Add on to previous message is that he needs appt with Dr. Clelia Croft or pain management. Spoke with patient and he was notified and clarified he has been given 2 week supply , And 1 every 12 hours to wean down,  and has to follow up with her or have appt with pain management for refills. I also explained he can call around to other pain clinics in area to see if he can get in sooner and also to call [facility] where his referral was sent to check status with them. I will also call them tomorrow for status. He voiced understanding and stated he is just having a hard time since he changed jobs and does nto have insurance and he appreciates everything and he is very sorry for the misunderstanding from last time. RX left for pick up at front desk.

## 2012-10-09 NOTE — Telephone Encounter (Signed)
Pt requesting status on rx refill dob [birth year]  bf

## 2012-10-09 NOTE — Telephone Encounter (Signed)
See later telephone call.

## 2012-10-12 ENCOUNTER — Encounter: Payer: Self-pay | Admitting: *Deleted

## 2012-10-18 MED ORDER — OXYCODONE HCL 10 MG PO TABS: 10.0000 mg | ORAL_TABLET | Freq: Two times a day (BID) | ORAL | Status: DC | PRN

## 2012-10-18 NOTE — Telephone Encounter (Signed)
[place] [facility] reviewed and appropriate. Pt will be out of his narcotic on 10/23/12 and I will be out of the office that week so he will not be able to see me in a follow-up visit as I requested when he is due for a refill. I am very sorry to hear that he has lost his health insurance, I know that will temporarily make it very hard to get the care he needs. Therefore, I will refill his narcotics for 1 additional month but needs to see me or pain clinic for any further refills. Refill printed and ready for pick-up.

## 2012-10-19 NOTE — Telephone Encounter (Signed)
Pt.notified

## 2012-10-22 ENCOUNTER — Telehealth: Payer: Self-pay

## 2012-10-22 NOTE — Telephone Encounter (Signed)
Patient advised we can not do this early, Dr Clelia Croft not here. To you FYI, patient called multiple times and demanded to speak to me or you.

## 2012-10-22 NOTE — Telephone Encounter (Signed)
Patient of Dr Clelia Croft. He works out of town and is leaving at [employer] and his rx is dated for tomorrow. If he waits until midnight the pharmacy will be closed (he uses [facility]). He was told to use the same pharmacy but in order to get his rx before he leaves he either needs to uses a different pharmacy or get a rx printed out today. He knows he is supposed to use the same pharmacy but he works out of town and is leaving early in the morning. Please call back at [phone].

## 2012-11-13 ENCOUNTER — Ambulatory Visit: Payer: Self-pay | Admitting: Family Medicine

## 2012-11-13 VITALS — BP 110/66 | HR 88 | Temp 98.4°F | Resp 18 | Wt 151.0 lb

## 2012-11-13 DIAGNOSIS — Z79899 Other long term (current) drug therapy: Secondary | ICD-10-CM

## 2012-11-13 DIAGNOSIS — G5600 Carpal tunnel syndrome, unspecified upper limb: Secondary | ICD-10-CM

## 2012-11-13 DIAGNOSIS — G894 Chronic pain syndrome: Secondary | ICD-10-CM

## 2012-11-13 DIAGNOSIS — M5030 Other cervical disc degeneration, unspecified cervical region: Secondary | ICD-10-CM

## 2012-11-13 MED ORDER — OXYCODONE HCL 10 MG PO TABS: 10.0000 mg | ORAL_TABLET | Freq: Three times a day (TID) | ORAL | Status: DC | PRN

## 2012-11-13 NOTE — Progress Notes (Signed)
  Subjective:    Patient ID: Cody Ree., male    DOB: [birth year], 32 y.o.   MRN: [id number] Chief Complaint  Patient presents with  . 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  Doesn't have the $$ to get the neurontin and other meds.  Is getting bills from [place] and though he had insurance at that time they didn't cover the visit. Had to cancel second opinion appt from [facility] as no $$ or ins  History reviewed. No pertinent past medical history. Current Outpatient Prescriptions on File Prior to Visit  Medication Sig Dispense Refill  . Multiple Vitamin (MULTIVITAMIN) tablet Take 1 tablet by mouth daily.       No current facility-administered medications on file prior to visit.   No Known Allergies   Review of Systems  Constitutional: Positive for fatigue. Negative for fever and chills.  Eyes: Positive for visual disturbance.  Gastrointestinal: Negative for abdominal pain, diarrhea and constipation.  Genitourinary: Negative for urgency, frequency, decreased urine volume and difficulty urinating.  Musculoskeletal: Positive for arthralgias, back pain, myalgias, neck pain and neck stiffness. Negative for gait problem.  Skin: Negative for color change and wound.  Neurological: Positive for weakness, numbness and headaches. Negative for dizziness and light-headedness.  Psychiatric/Behavioral: Positive for sleep disturbance, self-injury and dysphoric mood.      BP 110/66  Pulse 88  Temp(Src) 98.4 F (36.9 C) (Oral)  Resp 18  Wt 151 lb (68.493 kg)  SpO2 98% Objective:   Physical Exam  Constitutional: He is oriented to person, place, and time. He appears well-developed and well-nourished. No distress.  HENT:  Head: Normocephalic and atraumatic.  Eyes: No scleral icterus.  Pulmonary/Chest: Effort normal.  Neurological: He is alert and oriented to person, place, and time.  Skin: Skin is warm and  dry. He is not diaphoretic.  Psychiatric: He has a normal mood and affect. His behavior is normal.          Assessment & Plan:   Encounter for long-term (current) use of other medications  DDD (degenerative disc disease), cervical  Chronic pain syndrome  Carpal tunnel syndrome, unspecified laterality  [place] [facility] reviewed and no other rx from any other providers.  Refilled 3 mos of oxycodone for chronic pain.  Will need f/u before additional refills. Pt hoping to get back on insurance within the next 1-2 mos which will help him get the additional care he needs.  Meds ordered this encounter  Medications  . Oxycodone HCl 10 MG TABS    Sig: Take 1 tablet (10 mg total) by mouth every 8 (eight) hours as needed (severe pain). May fill on or after 11/13/12.    Dispense:  90 tablet    Refill:  0  .  Oxycodone HCl 10 MG TABS    Sig: Take 1 tablet (10 mg total) by mouth every 8 (eight) hours as needed (pain). May fill on or after 12/13/12.    Dispense:  90 tablet    Refill:  0  . Oxycodone HCl 10 MG TABS    Sig: Take 1 tablet (10 mg total) by mouth every 8 (eight) hours as needed (pain).    Dispense:  90 tablet    Refill:  0    May fill on or after 01/11/13.    Norberto Sorenson, MD MPH

## 2012-11-13 NOTE — Patient Instructions (Addendum)
[facility] Policy for Prescribing Controlled Substances (Revised 01/2012) 1. Prescriptions for controlled substances will be filled by ONE provider at [facility] with whom you have established and developed a plan for your care, including follow-up. 2. You are encouraged to schedule an appointment with your prescriber at our appointment center for follow-up visits whenever possible. 3. If you request a prescription for the controlled substance while at [facility] for an acute problem (with someone other than your regular prescriber), you MAY be given a ONE-TIME prescription for a 30-day supply of the controlled substance, to allow time for you to return to see your regular prescriber for additional prescriptions.  Call 1800 QUIT NOW and so they will send you patches for free without insurance.

## 2012-11-25 ENCOUNTER — Telehealth: Payer: Self-pay

## 2012-11-25 NOTE — Telephone Encounter (Signed)
Patient called on Saturday, 11/25/2012 at 6:20pm requesting for Dr. Clelia Croft to return his call regarding a medication she was going to prescribe for him.

## 2012-11-26 NOTE — Telephone Encounter (Signed)
I am only rxing pt oxycodone currently and he should have 3 months worth of rxs. We were weaning him off of cymbalta due to cost and had stopped his gabapentin and diclofenac due to cost and not helping.  Thanks.

## 2012-11-26 NOTE — Telephone Encounter (Signed)
Called pt, number out of service. Dr Clelia Croft do you know what pt is referring too, in case he call back today.

## 2012-11-27 NOTE — Telephone Encounter (Signed)
Unable to reach by phone

## 2012-12-06 ENCOUNTER — Ambulatory Visit (INDEPENDENT_AMBULATORY_CARE_PROVIDER_SITE_OTHER): Payer: Self-pay | Admitting: Family Medicine

## 2012-12-06 VITALS — BP 128/78 | HR 88 | Temp 98.2°F | Resp 16 | Ht 67.0 in | Wt 153.0 lb

## 2012-12-06 DIAGNOSIS — G5600 Carpal tunnel syndrome, unspecified upper limb: Secondary | ICD-10-CM

## 2012-12-06 DIAGNOSIS — M5030 Other cervical disc degeneration, unspecified cervical region: Secondary | ICD-10-CM

## 2012-12-06 DIAGNOSIS — Z79899 Other long term (current) drug therapy: Secondary | ICD-10-CM

## 2012-12-06 DIAGNOSIS — G894 Chronic pain syndrome: Secondary | ICD-10-CM

## 2012-12-06 DIAGNOSIS — F410 Panic disorder [episodic paroxysmal anxiety] without agoraphobia: Secondary | ICD-10-CM | POA: Insufficient documentation

## 2012-12-06 MED ORDER — OXYCODONE HCL 10 MG PO TABS: 10.0000 mg | ORAL_TABLET | Freq: Four times a day (QID) | ORAL | Status: DC | PRN

## 2012-12-06 MED ORDER — DIAZEPAM 5 MG PO TABS: 5.0000 mg | ORAL_TABLET | Freq: Every day | ORAL | Status: DC | PRN

## 2012-12-06 MED ORDER — PREDNISONE 20 MG PO TABS: ORAL_TABLET | ORAL | Status: DC

## 2012-12-06 NOTE — Progress Notes (Signed)
Subjective:    Patient ID: Cody Ree., male    DOB: December 17, 1980, 32 y.o.   MRN: [id number] Chief Complaint  Patient presents with  . 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  They have had an intense couple of days at work but his uncle didn't know how severe Cody Sims's CTS and c-spine DDD was and so had questions today and what he should be able to do. His uncle would like to be able to help him to get in to pain management.  His uncle was in a wheelchair for several years prior to a lumbar surgery and relates what Cody Sims has previously told me - that his whole extended family - parents, aunts, uncles all have severe DDD/back problems. His father just had back surgery as well.    Having panic attacks intermittnetly, usually about 1 every other day, last for about an hour - he gets dizzy and feels like he can't breath.  Come out of no where - doesn't know what triggers them. On prozac previously but didn't help. We tried cymbalta sev mos ago and didn't really notice any difference with this. His uncle also reports that eerybody in family is on diazepam and xanax as well.  Cody Sims is still having a lot of bills from [place] [employer] - his prior ins [employer] denied their payment due to pre-existing condition (???)  History reviewed. No pertinent past medical history. Current Outpatient Prescriptions on File Prior to Visit  Medication Sig Dispense Refill  . Oxycodone HCl 10 MG TABS Take 1 tablet (10 mg total) by mouth every 8 (eight) hours as needed (pain). May fill on or after 12/13/12.  90 tablet  0  . Oxycodone HCl 10 MG TABS Take 1 tablet (10 mg total) by mouth every 8 (eight) hours as needed (pain).  90 tablet  0  . Multiple Vitamin (MULTIVITAMIN) tablet Take 1 tablet by mouth daily.       No current facility-administered medications on file prior to visit.   No Known Allergies   Review of Systems  Constitutional: Negative for fever and  chills.  HENT: Positive for neck pain and neck stiffness.   Gastrointestinal: Negative for abdominal pain, diarrhea and constipation.  Genitourinary: Negative for urgency, frequency, decreased urine volume and difficulty urinating.  Musculoskeletal: Positive for myalgias and back pain. Negative for gait problem.  Skin: Negative for color change and wound.  Neurological: Positive for weakness and numbness. Negative for dizziness and light-headedness.  Psychiatric/Behavioral: Positive for sleep disturbance. The patient is nervous/anxious.       BP 128/78  Pulse 88  Temp(Src) 98.2 F (36.8 C) (Oral)  Resp 16  Ht 5\' 7"  (1.702 m)  Wt 153 lb (69.4 kg)  BMI 23.96 kg/m2  SpO2 99% Objective:   Physical Exam  Constitutional: He is oriented to person, place, and time. He appears well-developed and well-nourished. No distress.  HENT:  Head: Normocephalic and atraumatic.  Eyes: No scleral icterus.  Pulmonary/Chest: Effort normal.  Neurological: He is alert and oriented to person, place, and time.  Skin: Skin is warm and dry. He is not diaphoretic.  Psychiatric: He has a normal mood and affect. His behavior is normal.      Assessment & Plan:  Carpal tunnel syndrome, unspecified laterality  Chronic pain syndrome - using oxycodone 10 q6hrs rather than q8hrs so will change to #120/day.  Will authorize 3 new  rxs for end of Sept, Oct, and Nov after he drops off prior rx for Sept and Oct here for destruction.  Will not increase any further. Needs pain clinic. Discussed disability and/or vocational rehab opportunities.  DDD (degenerative disc disease), cervical  Encounter for long-term (current) use of other medications  Anxiety attack - start prn valium - warned of unintentional OD risks in combo with narcotics. Do NOT use more than prescribed. If needs any more, should consider trial of ssri - though has failed in past and recently failed cymbalta.  Meds ordered this encounter  Medications   . predniSONE (DELTASONE) 20 MG tablet    Sig: Take 4 tabs qd x 3d, take 3 tabs qd x3d, take 2 tabs qd x 3d, take 1 tab qd x 3d, take 1/2 tab qd x 4d.    Dispense:  32 tablet    Refill:  0  . Oxycodone HCl 10 MG TABS    Sig: Take 1 tablet (10 mg total) by mouth every 6 (six) hours as needed (severe pain). May fill today 12/06/12    Dispense:  60 tablet    Refill:  0  . diazepam (VALIUM) 5 MG tablet    Sig: Take 1 tablet (5 mg total) by mouth daily as needed for anxiety.    Dispense:  30 tablet    Refill:  2

## 2012-12-06 NOTE — Patient Instructions (Addendum)
Consider contacting a lawyer to help with consider a disability application.   Consider contacting legal aide to ask for lawyer recommendations. Look into vocational rehab for job retraining and to help with disability. Bring by your other oxycodone prescriptions and drop them off. When I have them, I will replace them for oxycodone #120/month.  This is the absolute maximum I will prescribe and make sure you only use the same pharmacy.  You will need to come back in 3 months for refills if you have not been able to establish with pain management at that time.

## 2013-01-10 ENCOUNTER — Other Ambulatory Visit: Payer: Self-pay | Admitting: Family Medicine

## 2013-01-10 MED ORDER — OXYCODONE HCL 10 MG PO TABS: 10.0000 mg | ORAL_TABLET | Freq: Four times a day (QID) | ORAL | Status: DC | PRN

## 2013-01-25 ENCOUNTER — Other Ambulatory Visit: Payer: Self-pay

## 2013-02-08 ENCOUNTER — Telehealth: Payer: Self-pay

## 2013-02-08 NOTE — Telephone Encounter (Signed)
Dr.Shaw, Pt would like to speak with you before you leave today, he refused to give any further details as to what the call was regarding. Best#[phone]

## 2013-02-09 MED ORDER — OXYCODONE HCL 10 MG PO TABS: 10.0000 mg | ORAL_TABLET | Freq: Four times a day (QID) | ORAL | Status: DC | PRN

## 2013-02-09 MED ORDER — OXYCODONE HCL 10 MG PO TABS: 10.0000 mg | ORAL_TABLET | Freq: Three times a day (TID) | ORAL | Status: DC | PRN

## 2013-02-09 NOTE — Telephone Encounter (Signed)
Oxycodone refills for Nov 22 and Dec 22 printed at 104. Will have brought up to 102 for pt pick up this evening.

## 2013-02-09 NOTE — Telephone Encounter (Signed)
Left message for him to call me back.  

## 2013-02-09 NOTE — Telephone Encounter (Signed)
Pt Cody Sims and he is requesting a RF of his oxycodone. He got his last RF on 01/10/13. Pt stated that he does not want to be rude or pushy, but asked that if at all possible, he would like to get the RF today so that he can p/up while he is in town working today. He lives out of town and doesn't have a car so it is difficult for his to get back in to p/up. I advised that I would mark message w/"red flag" so that Dr Clelia Croft will see it more readily and that he can call back to check status if he has not heard from [place] by the time he leaves town.

## 2013-02-09 NOTE — Telephone Encounter (Signed)
Did review due to "red flag" - thanks;).  Will address by end of business day today and print refill as long as [place] [facility] is appropriate, etc..

## 2013-02-09 NOTE — Telephone Encounter (Signed)
Left message advised ready for pickup 

## 2013-03-07 ENCOUNTER — Telehealth: Payer: Self-pay

## 2013-03-07 NOTE — Telephone Encounter (Signed)
Pt is has questions for dr Clelia Croft about his medications   Best number is [phone]

## 2013-03-08 ENCOUNTER — Telehealth: Payer: Self-pay

## 2013-03-08 MED ORDER — DIAZEPAM 5 MG PO TABS: 5.0000 mg | ORAL_TABLET | Freq: Every day | ORAL | Status: DC | PRN

## 2013-03-08 MED ORDER — OXYCODONE HCL 10 MG PO TABS: 10.0000 mg | ORAL_TABLET | Freq: Three times a day (TID) | ORAL | Status: DC | PRN

## 2013-03-08 NOTE — Telephone Encounter (Signed)
Refill diazepam (VALIUM) 5 MG tablet   Only given 90 Oxycodone HCl 10 MG TABS   [phone]

## 2013-03-08 NOTE — Telephone Encounter (Signed)
Patient came in and wants sooner fill than 03/12/13. Advised him per Dr Clelia Croft he has to fill on schedule. He must stay on schedule now , and also when Dr Clelia Croft is out on maternity leave. He exchanged the Rx from Nov for the one written today. Rx from November was shredded.

## 2013-03-08 NOTE — Telephone Encounter (Signed)
Lm with gentleman that rx is ready for pickup

## 2013-03-08 NOTE — Telephone Encounter (Signed)
Patient is completely out of medication and asked if his oxycodone can be written for a sooner (today because he is completley out- due to taking overtime at work so much) fill date before Dr. Clelia Croft leaves today before 4pm.  Written date says 03/12/13. Please advise. Thank you! Patient called to ask this  Because he gets off work at 5:30 and wants it to be corrected before he picks up.   [phone]

## 2013-03-08 NOTE — Telephone Encounter (Signed)
Reprinted rx. needs to drop off prior rx for oxycodone to get new.  Needs f/u in January to get further rxs since I will be out on maternity leave in Feb.

## 2013-04-06 ENCOUNTER — Ambulatory Visit: Payer: Self-pay | Admitting: Family Medicine

## 2013-04-06 VITALS — BP 110/60 | HR 108 | Temp 98.6°F | Resp 16 | Ht 67.0 in | Wt 150.0 lb

## 2013-04-06 DIAGNOSIS — G894 Chronic pain syndrome: Secondary | ICD-10-CM

## 2013-04-06 MED ORDER — OXYCODONE HCL 10 MG PO TABS
10.0000 mg | ORAL_TABLET | Freq: Four times a day (QID) | ORAL | Status: DC | PRN
Start: 2013-04-06 — End: 2013-04-27

## 2013-04-06 MED ORDER — PREDNISONE 20 MG PO TABS: ORAL_TABLET | ORAL | Status: DC

## 2013-04-06 MED ORDER — DIAZEPAM 5 MG PO TABS: 5.0000 mg | ORAL_TABLET | Freq: Every day | ORAL | Status: DC | PRN

## 2013-04-06 MED ORDER — OXYCODONE HCL 10 MG PO TABS: 10.0000 mg | ORAL_TABLET | Freq: Four times a day (QID) | ORAL | Status: DC | PRN

## 2013-04-06 NOTE — Progress Notes (Signed)
[facility] [address] [place] [address] [id number]  Date:  04/06/2013   Name:  Cody Sims.   DOB:  04-13-1980   MRN:  [id number]  PCP:  Norberto Sorenson, MD    Chief Complaint: Medication Refill   History of Present Illness:  Cody Maret. is a 33 y.o. very pleasant male patient who presents with the following:  Here today for medication refills- needs oxycodone, valium and prednisone if possible.  He has chronic spine pain.  He filled a 30 day supply on 12/22.  It says a 40 day supply; however it looks like maybe it was meant to be written for every 6 hours as this had been his typical routine.  In that case it would be a 30 day supply.  Reviewed controlled substance database and he has no medications not written from our office.  It appears that he has been using his medications as prescribed.    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   He had an MRI in April of 2014 IMPRESSION:  Disc degeneration and spondylosis, most prominent at C5-6 where  there is diffuse uncinate spurring and a right paracentral disc  protrusion with cord deformity on the right and foraminal  encroachment bilaterally.  Mild spondylosis at C3-4, C4-5, and C6-7.  Reports that he had insurance at the time of his MRI.  However he was then fired from his job, lost his insurance and has not been able to see NSG as he had planned.  He is instead relying on pain medications to manage his sx.  He states he would like to get into pain management but cannot afford to do so   He will sometimes use some prednisone.  He last used it before thanksgiving. He has taken a taper on occasion to help with ihs pain.    Patient Active Problem List   Diagnosis Date Noted  . Anxiety attack 12/06/2012  . Chronic pain syndrome 08/11/2012  . DDD (degenerative disc disease), cervical 08/11/2012  . Carpal tunnel syndrome 08/11/2012  . Encounter for long-term (current)  use of other medications 08/11/2012    History reviewed. No pertinent past medical history.  No past surgical history on file.  History  Substance Use Topics  . Smoking status: Current Every Day Smoker  . Smokeless tobacco: Not on file  . Alcohol Use: Not on file    Family History  Problem Relation Age of Onset  . Arthritis Mother     cervical DDD req anterior fusion  . Arthritis Father     No Known Allergies  Medication list has been reviewed and updated.  Current Outpatient Prescriptions on File Prior to Visit  Medication Sig Dispense Refill  . diazepam (VALIUM) 5 MG tablet Take 1 tablet (5 mg total) by mouth daily as needed for anxiety.  30 tablet  0  . Multiple Vitamin (MULTIVITAMIN) tablet Take 1 tablet by mouth daily.      . Oxycodone HCl 10 MG TABS Take 1 tablet (10 mg total) by mouth every 6 (six) hours as needed (pain). May fill on or after 02/10/13  120 tablet  0  . Oxycodone HCl 10 MG TABS Take 1 tablet (10 mg total) by mouth every 8 (eight) hours as needed (pain). May fill on or after 03/12/13.  120 tablet  0  . predniSONE (DELTASONE) 20 MG tablet Take 4 tabs qd x 3d,  take 3 tabs qd x3d, take 2 tabs qd x 3d, take 1 tab qd x 3d, take 1/2 tab qd x 4d.  32 tablet  0  . Oxycodone HCl 10 MG TABS Take 1 tablet (10 mg total) by mouth every 6 (six) hours as needed (severe pain). May fill today 12/06/12  60 tablet  0   No current facility-administered medications on file prior to visit.    Review of Systems:  As per HPI- otherwise negative.   Physical Examination: Filed Vitals:   04/06/13 1902  BP: 110/60  Pulse: 108  Temp: 98.6 F (37 C)  Resp: 16   Filed Vitals:   04/06/13 1902  Height: 5\' 7"  (1.702 m)  Weight: 150 lb (68.04 kg)   Body mass index is 23.49 kg/(m^2). Ideal Body Weight: Weight in (lb) to have BMI = 25: 159.3  GEN: WDWN, NAD, Non-toxic, A & O x 3 HEENT: Atraumatic, Normocephalic. Neck supple. No masses, No LAD. Ears and Nose: No external  deformity. CV: RRR, No M/G/R. No JVD. No thrill. No extra heart sounds. PULM: CTA B, no wheezes, crackles, rhonchi. No retractions. No resp. distress. No accessory muscle use. ABD: S, NT, ND, +BS. No rebound. No HSM. EXTR: No c/c/e NEURO Normal gait.  PSYCH: Normally interactive. Conversant. Not depressed or anxious appearing.  Calm demeanor.    Assessment and Plan: Chronic pain syndrome - Plan: Oxycodone HCl 10 MG TABS, Oxycodone HCl 10 MG TABS, diazepam (VALIUM) 5 MG tablet, predniSONE (DELTASONE) 20 MG tablet  Chronic pain due to degenerative change in his neck.  Refilled his usual doses of oxycodone, valium and also gave a course of prednisone.  encouraged him to explore his options as far as medicaid or possibly medicare disability.  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    Signed Abbe AmsterdamJessica Angellina Ferdinand, MD

## 2013-04-06 NOTE — Patient Instructions (Signed)
Try to look into applying for medicare or medicaid; these programs might be able to help you with your health care.

## 2013-04-24 ENCOUNTER — Telehealth: Payer: Self-pay

## 2013-04-24 DIAGNOSIS — G894 Chronic pain syndrome: Secondary | ICD-10-CM

## 2013-04-24 NOTE — Telephone Encounter (Signed)
Patient states that his Oxycodone RX is incorrect. It's normally written for 10 mg however patient says that when he went to pick it up from his pharmacy it was something different.  [phone]

## 2013-04-26 NOTE — Telephone Encounter (Signed)
Tried to call pt. No ans/no VM set up. We printed the Rx for 10 mg. Pt needs to have pharm recheck the Rx.

## 2013-04-27 ENCOUNTER — Telehealth: Payer: Self-pay | Admitting: Family Medicine

## 2013-04-27 DIAGNOSIS — G894 Chronic pain syndrome: Secondary | ICD-10-CM

## 2013-04-27 MED ORDER — OXYCODONE HCL 10 MG PO TABS: 10.0000 mg | ORAL_TABLET | Freq: Four times a day (QID) | ORAL | Status: DC | PRN

## 2013-04-27 NOTE — Telephone Encounter (Signed)
Thanks, this error has occurred before with him the computer defaults to #60. Just make sure he brings in the old Rx. Thanks.

## 2013-04-27 NOTE — Telephone Encounter (Signed)
It looks like I did make an error- he gets 120 oxycyconde a month and I accidentally gave him #60 to fill this month on 2/17.  I have send him a mychart message and will remedy this situation when I get back in town on Sunday. The rx is not due to be filled yet in any case JC

## 2013-04-27 NOTE — Telephone Encounter (Signed)
Pt requesting change in oxycodone rx for 120 pills instead of 60 which was provided by Dr. Patsy Lageropland; pt to bring in rx for 60 tablets.

## 2013-04-27 NOTE — Telephone Encounter (Signed)
Pended RX this should come from Dr Patsy Lageropland only in Dr Clelia CroftShaw absence, per Dr Clelia CroftShaw. The Rx is written not to be filled until 05/08/13. He must exchange the Rx for  Quantity of 60 to get the one for quantity of 120.

## 2013-04-27 NOTE — Telephone Encounter (Signed)
Pt came by today, says he talked to someone here today and was told to come in to pick up the remainder of is oxy rx. The issue is that he was only written 60 and is supposed to get 120. I can find no record of this conversation documented.  Pt call today   [phone]

## 2013-04-29 ENCOUNTER — Encounter: Payer: Self-pay | Admitting: Family Medicine

## 2013-04-29 NOTE — Telephone Encounter (Signed)
Per notes Dr. Katrinka BlazingSmith took care of this change already. Thank you

## 2013-05-06 ENCOUNTER — Telehealth: Payer: Self-pay
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
## 2013-05-10 ENCOUNTER — Telehealth: Payer: Self-pay

## 2013-05-10 NOTE — Telephone Encounter (Signed)
Refill on Cymbalta. [facility]   [phone]

## 2013-05-28 ENCOUNTER — Other Ambulatory Visit: Payer: Self-pay

## 2013-05-28 DIAGNOSIS — F329 Major depressive disorder, single episode, unspecified: Secondary | ICD-10-CM

## 2013-05-28 DIAGNOSIS — F32A Depression, unspecified: Secondary | ICD-10-CM

## 2013-05-28 DIAGNOSIS — G894 Chronic pain syndrome: Secondary | ICD-10-CM

## 2013-05-28 NOTE — Telephone Encounter (Signed)
[phone]   All medications  refilled

## 2013-05-29 NOTE — Telephone Encounter (Signed)
I will be glad to do this tomorrow when I am back in the office.  Tried to call and let him know but VM not set up on his phone

## 2013-05-29 NOTE — Telephone Encounter (Signed)
Called pt to clarify what Rxs are needed. He requests RFs of oxycodone, diazepam, and would also like the regular RF of prednisone Dr Clelia CroftShaw Rxs for him prn. 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 Dr Clelia CroftShaw Rxs a round of prednisone to reduce the swelling. Pended RFs for review. Last oxy Rx was "to be filled 05/08/13".

## 2013-05-29 NOTE — Telephone Encounter (Signed)
Pt was last seen by Dr. Patsy Lageropland, so will forward to her

## 2013-05-30 MED ORDER — OXYCODONE HCL 10 MG PO TABS: 10.0000 mg | ORAL_TABLET | Freq: Four times a day (QID) | ORAL | Status: DC | PRN

## 2013-05-30 MED ORDER — PREDNISONE 20 MG PO TABS: ORAL_TABLET | ORAL | Status: DC

## 2013-05-30 MED ORDER — DIAZEPAM 5 MG PO TABS: 5.0000 mg | ORAL_TABLET | Freq: Every day | ORAL | Status: DC | PRN

## 2013-05-30 MED ORDER — DULOXETINE HCL 60 MG PO CPEP: 60.0000 mg | ORAL_CAPSULE | Freq: Every day | ORAL | Status: DC

## 2013-05-30 NOTE — Telephone Encounter (Signed)
Called him back regarding his chronic back pain. 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 He will use prednisone periodically - usually after unloading this truck.  Admits he has taken a little more oxycodone lately so he has finished his rx early.   rx for valium 5 mg, prednisone 20 mg, 60 x3, 40 x3, 20 x3 Gave rx for oxycodone #120 (not the rx for #60- this was in error Call in valium and prednisone.  He also wishes to increase his cymbalta- will do this for him. 60 mg a day

## 2013-05-30 NOTE — Telephone Encounter (Signed)
Pt CB. I advised Dr Patsy Lageropland tried to call but VM not set up. I updated his phone # in demographics bc it was incorrect. I advised that I will have Dr Patsy Lageropland get Rxs ready for him and call him back if there is a problem, other wise can p/up later today.

## 2013-06-19 ENCOUNTER — Telehealth: Payer: Self-pay

## 2013-06-19 DIAGNOSIS — F329 Major depressive disorder, single episode, unspecified: Secondary | ICD-10-CM

## 2013-06-19 DIAGNOSIS — G894 Chronic pain syndrome: Secondary | ICD-10-CM

## 2013-06-19 DIAGNOSIS — F32A Depression, unspecified: Secondary | ICD-10-CM

## 2013-06-19 MED ORDER — OXYCODONE HCL 10 MG PO TABS: 10.0000 mg | ORAL_TABLET | Freq: Four times a day (QID) | ORAL | Status: DC | PRN

## 2013-06-19 NOTE — Telephone Encounter (Signed)
Called him back- he is a little early for oxycodone refill.  However he is coming into town and now has a chance to pick it up. This is fine, will rx with post date to fill on 06/24/13. Will leave in pick up for him

## 2013-06-19 NOTE — Telephone Encounter (Signed)
Need all medications tomorrow.   [phone]  New number

## 2013-06-20 ENCOUNTER — Telehealth: Payer: Self-pay

## 2013-06-20 NOTE — Telephone Encounter (Signed)
Patient called stated he picked up his prescription yesterday and one of the medication can not be filled until April 5th. Patient thought he wad able to take medication back with him to [place]. He will like for Dr. Patsy Lageropland to give him a call.

## 2013-06-21 ENCOUNTER — Other Ambulatory Visit: Payer: Self-pay | Admitting: Family Medicine

## 2013-06-21 DIAGNOSIS — G894 Chronic pain syndrome: Secondary | ICD-10-CM

## 2013-06-21 MED ORDER — OXYCODONE HCL 10 MG PO TABS: 10.0000 mg | ORAL_TABLET | Freq: Four times a day (QID) | ORAL | Status: DC | PRN

## 2013-06-21 NOTE — Progress Notes (Signed)
Pt brought in rx for oxycodond #120 that I wrote on 3/31 because he is in town today and wishes to fill today and his regular pharmacy. Did a new rx for him to fill today

## 2013-06-21 NOTE — Telephone Encounter (Signed)
Tried to call pt back and number is disconnected.  Pt was advised he could pick up the prescription early but he would not be able to refill until April 5th. Pt may take that prescription with him to [place] and have it filled down there.

## 2013-06-23 NOTE — Telephone Encounter (Signed)
Pt says he's already spoke with Dr. Patsy Lageropland and everything is good

## 2013-07-12 ENCOUNTER — Telehealth: Payer: Self-pay | Admitting: Family Medicine

## 2013-07-12 DIAGNOSIS — G894 Chronic pain syndrome: Secondary | ICD-10-CM

## 2013-07-12 DIAGNOSIS — F32A Depression, unspecified: Secondary | ICD-10-CM

## 2013-07-12 DIAGNOSIS — F329 Major depressive disorder, single episode, unspecified: Secondary | ICD-10-CM

## 2013-07-12 MED ORDER — OXYCODONE HCL 10 MG PO TABS: 10.0000 mg | ORAL_TABLET | Freq: Four times a day (QID) | ORAL | Status: DC | PRN

## 2013-07-12 MED ORDER — DULOXETINE HCL 60 MG PO CPEP: 60.0000 mg | ORAL_CAPSULE | Freq: Every day | ORAL | Status: AC

## 2013-07-12 NOTE — Telephone Encounter (Signed)
Patient needs to discuss his Cymbalta and Oxycodone.  [phone]

## 2013-07-12 NOTE — Telephone Encounter (Signed)
Rtn pt call- Gave him information about prescription assistance- Cymbalta is too expensive. He would like to pick up refills on his Oxycodone and Cymbalta.

## 2013-07-12 NOTE — Telephone Encounter (Signed)
Called him- he is early for pick-up.  Will refill his oxycodone as he states he is again going out of town to [place] for work. However cannot pick up oxycodone until the 28th

## 2013-07-24 ENCOUNTER — Telehealth: Payer: Self-pay

## 2013-07-24 DIAGNOSIS — G894 Chronic pain syndrome: Secondary | ICD-10-CM

## 2013-07-24 NOTE — Telephone Encounter (Signed)
Called him back- no answer.  I do not know his new pharmacy.  It looks like he is nearly due for a valium refill so this is ok- please call or send a mychart message with his preferred new pharmacy

## 2013-07-24 NOTE — Telephone Encounter (Signed)
PT STATES HE IS IN NEED OF HIS VALIUM 5MG S, IS CHANGING PHARMACIES SINCE HE DOESN'T HAVE ANY INSURANCE PLEASE CALL [phone]

## 2013-07-25 ENCOUNTER — Telehealth: Payer: Self-pay

## 2013-07-25 DIAGNOSIS — G894 Chronic pain syndrome: Secondary | ICD-10-CM

## 2013-07-25 MED ORDER — DIAZEPAM 5 MG PO TABS: 5.0000 mg | ORAL_TABLET | Freq: Every day | ORAL | Status: DC | PRN

## 2013-07-25 NOTE — Addendum Note (Signed)
Addended by: Abbe AmsterdamOPLAND, Dusty Wagoner C on: 07/25/2013 12:04 PM   Modules accepted: Orders

## 2013-07-25 NOTE — Telephone Encounter (Signed)
Dr.Copland, Pt [facility] [address] (refering back to your previous message for this pt).  He does not have a phone at this time so that's why you were not able to get in touch with him the first time.

## 2013-07-25 NOTE — Telephone Encounter (Signed)
Patient called back with contact number. [phone]

## 2013-07-25 NOTE — Telephone Encounter (Signed)
Clld pt - LMOVM of [phone] re Medication refill request on his Diazepam. Pt was advsd that the physician will call in the refill.

## 2013-08-06 ENCOUNTER — Telehealth: Payer: Self-pay

## 2013-08-06 DIAGNOSIS — G894 Chronic pain syndrome: Secondary | ICD-10-CM

## 2013-08-06 MED ORDER — OXYCODONE HCL 10 MG PO TABS: 10.0000 mg | ORAL_TABLET | Freq: Four times a day (QID) | ORAL | Status: DC | PRN

## 2013-08-06 NOTE — Telephone Encounter (Signed)
Carley Hammedva- would you feel comfortable refilling his oxycodone?  I am off tomorrow and hate to ask one of the PAs to do it.  Please note this his last rx was written to be filled on 4/28 so this rx would also need to be post- dated.  It seems that he is traveling so needs to get the paper rx early.    If you would rather not that is ok, just let me know JC

## 2013-08-06 NOTE — Telephone Encounter (Signed)
Refill printed for pt pick-up and post-dated.

## 2013-08-06 NOTE — Telephone Encounter (Signed)
Pt is in need of oxcodone, and leaves the state in the morning, would like to know if this can be refilled by then.

## 2013-08-07 NOTE — Telephone Encounter (Signed)
LM on cell Rx in pick up drawer.

## 2013-08-10 ENCOUNTER — Ambulatory Visit: Payer: Self-pay | Admitting: Family Medicine

## 2013-08-14 ENCOUNTER — Telehealth: Payer: Self-pay

## 2013-08-14 DIAGNOSIS — G894 Chronic pain syndrome: Secondary | ICD-10-CM

## 2013-08-14 NOTE — Telephone Encounter (Signed)
Pt has a question about his medication. Please call [phone]

## 2013-08-14 NOTE — Telephone Encounter (Signed)
Patient came in today to try to exchange the Rx for one dated today, he was advised by Donna Christen we can not change the date he can fill on 08/16/13, Dr Clelia Croft is not expected in the office until tomorrow.

## 2013-08-14 NOTE — Telephone Encounter (Signed)
Patient states that he usually sees Dr. Clelia Croft but has been seeing Dr.Copland.  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 He states it is a couple days early but he has to go out of town for work.

## 2013-08-15 MED ORDER — OXYCODONE HCL 10 MG PO TABS: 10.0000 mg | ORAL_TABLET | Freq: Four times a day (QID) | ORAL | Status: DC | PRN

## 2013-08-15 NOTE — Telephone Encounter (Signed)
Patient continues to call, want you to be aware, he was very rude to staff yesterday. I would appreciate it if you would speak to him regarding this, our staff works too hard to be treated this way, and he needs to be aware this will not be tolerated.

## 2013-08-15 NOTE — Telephone Encounter (Signed)
Pt called back to ask if he can get this filled today. He works out of town and is supposed to work long hours today and can not work w/out the pain med. Dr Patsy Lager agreed to review the request in Dr Alver Fisher absence since she has been seeing pt most recently.

## 2013-08-15 NOTE — Telephone Encounter (Signed)
He brought in rx for oxycodone dated to fill on 5/28. Destroyed this rx and wrote another to be filled today 5/27.    Meds ordered this encounter  Medications  . Oxycodone HCl 10 MG TABS    Sig: Take 1 tablet (10 mg total) by mouth every 6 (six) hours as needed (pain). May fill on or after 08/15/13.    Dispense:  120 tablet    Refill:  0

## 2013-08-15 NOTE — Telephone Encounter (Signed)
Poor attitude toward staff noted. I will speak to pt about this the next time I see him.  Unfortunately, he did not show up to his OV that was sched w/ me on 5/22.  It is ok that the pt use a different pharmacy to fill his medications when he is out of town but I will not issue him any further early refills.

## 2013-08-24 ENCOUNTER — Telehealth: Payer: Self-pay | Admitting: Family Medicine

## 2013-08-24 DIAGNOSIS — G894 Chronic pain syndrome: Secondary | ICD-10-CM

## 2013-08-24 MED ORDER — OXYCODONE HCL 10 MG PO TABS: 10.0000 mg | ORAL_TABLET | Freq: Four times a day (QID) | ORAL | Status: DC | PRN

## 2013-08-24 NOTE — Telephone Encounter (Signed)
Pharmacist called to verify it is OK to fill this Rx early. I read him note from Dr Patsy Lager and he agreed to fill.
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
## 2013-09-01 ENCOUNTER — Telehealth: Payer: Self-pay

## 2013-09-01 DIAGNOSIS — G894 Chronic pain syndrome: Secondary | ICD-10-CM

## 2013-09-01 NOTE — Telephone Encounter (Signed)
COPLAND - Pt says he needs a refill on the diazepam and the oxycodone.  He says that he has refills left on his diazepam, but now that he is staying in [place] he cant transfer pharmacies but once??  He is in town and would like to pick these scripts up before he goes back to [place].  He wonders if we could get the pharmacy changed for him.  Please call [phone]

## 2013-09-02 ENCOUNTER — Encounter: Payer: Self-pay | Admitting: Family Medicine

## 2013-09-02 MED ORDER — OXYCODONE HCL 10 MG PO TABS: 10.0000 mg | ORAL_TABLET | Freq: Four times a day (QID) | ORAL | Status: DC | PRN

## 2013-09-02 NOTE — Telephone Encounter (Signed)
I really appreciate your care of this patient during my leave. I know that he is in a difficult situation but now I feel it is continually putting us as prescribers in tight spot.  He has scheduled numerous follow-up office visits with me to discuss his medications and invariably no-shows without cancelling. I prefer that my patients on chronic narcotics be seen every 3 months and given all 3 rxs at that time as it is an inconvenience to our staff and quite complicated when pts call in for refills and that has been repeatedly explained to [name] without any compliance on his part.  He last did not show up to a scheduled office visit less than 1 month previously.  I have referred him to pain management multiple times but patient has declined due to cost reasons. I agree that there are numerous concerns with Mr. Cody Sims that lead me to feel that it is unsafe to continue to prescribe him narcotics but know that he does not want to stop narcotics so is unlikely to consent to a taper off.  I think I will send pt a letter explaining requirements for continuation of narcotic therapy at our office and inform pt in writing that he will need to seek care elsewhere if he continues to use more medication than prescribed.  I would appreciate any thoughts or insights you may have on how to best and safely care for Mr. Cody Sims. . . .

## 2013-09-02 NOTE — Addendum Note (Signed)
Addended by: Nelva NayMARTE, Refugio Mcconico M on: 09/02/2013 10:29 AM   Modules accepted: Orders

## 2013-09-02 NOTE — Telephone Encounter (Signed)
Cody Sims- I think a letter is a great idea.  To be clear,I do not think he is actually using more than prescribed (unless something else happened with he partial rx that he "lost" last month).  However his habits of asking for the rx early and demands that it be filled within 24 hours on the weekend is hard to deal with.  I would insist that he come in for a follow-up, and think that either her needs to enter into a more restrictive contract with us or seek this care elsewhere

## 2013-09-02 NOTE — Telephone Encounter (Signed)
Called him back yesterday to discuss.  Explained that I have said no to early refills.  However what he states that he needs is a post- dated rx to pick up mediation later this month.  He is traveling to [place] again for work and will not be back in [place] for 3 or 4 weeks. He will be due to fill another oxycodone rx on 6/27.   Also, we have not seen him for a visit in 6 months.   Discussed with Rhoderick MoodyHeather Marte, PA-C who kindly agreed to write ocycodone rx under my direction Please rx oxycodone 10mg  one every 6 hours for pain.  #120, no refills.  Ok to fill on 09/14/2013 Dr. Leander RamsShaw FYI we need to get him in for an appt.  Also it may be best to refer him to pain management if he continues to have early requests, etc.

## 2013-09-02 NOTE — Telephone Encounter (Signed)
Letter written

## 2013-09-02 NOTE — Telephone Encounter (Signed)
Rx written by Dr. Patsy Lageropland shredded and re-printed under my name for patient to pick up.

## 2013-09-09 ENCOUNTER — Encounter (HOSPITAL_COMMUNITY): Payer: Self-pay | Admitting: Emergency Medicine

## 2013-09-09 ENCOUNTER — Emergency Department (HOSPITAL_COMMUNITY): Payer: Self-pay

## 2013-09-09 ENCOUNTER — Other Ambulatory Visit: Payer: Self-pay

## 2013-09-09 ENCOUNTER — Emergency Department (HOSPITAL_COMMUNITY)
Admission: EM | Admit: 2013-09-09 | Discharge: 2013-09-09 | Discharge status: Against Medical Advice | Payer: Self-pay | Attending: Internal Medicine | Admitting: Internal Medicine

## 2013-09-09 DIAGNOSIS — F1010 Alcohol abuse, uncomplicated: Secondary | ICD-10-CM | POA: Insufficient documentation

## 2013-09-09 DIAGNOSIS — R4182 Altered mental status, unspecified: Secondary | ICD-10-CM

## 2013-09-09 DIAGNOSIS — F10920 Alcohol use, unspecified with intoxication, uncomplicated: Secondary | ICD-10-CM

## 2013-09-09 LAB — CBC WITH DIFFERENTIAL/PLATELET
Basophils Absolute: 0 10*3/uL (ref 0.0–0.1)
Basophils Relative: 0 % (ref 0–1)
EOS ABS: 0.2 10*3/uL (ref 0.0–0.7)
Eosinophils Relative: 2 % (ref 0–5)
HCT: 39.1 % (ref 39.0–52.0)
Hemoglobin: 13.1 g/dL (ref 13.0–17.0)
LYMPHS ABS: 2.5 10*3/uL (ref 0.7–4.0)
LYMPHS PCT: 32 % (ref 12–46)
MCH: 32.9 pg (ref 26.0–34.0)
MCHC: 33.5 g/dL (ref 30.0–36.0)
MCV: 98.2 fL (ref 78.0–100.0)
Monocytes Absolute: 0.4 10*3/uL (ref 0.1–1.0)
Monocytes Relative: 5 % (ref 3–12)
NEUTROS ABS: 4.8 10*3/uL (ref 1.7–7.7)
NEUTROS PCT: 61 % (ref 43–77)
PLATELETS: 243 10*3/uL (ref 150–400)
RBC: 3.98 MIL/uL — AB (ref 4.22–5.81)
RDW: 13.1 % (ref 11.5–15.5)
WBC: 7.8 10*3/uL (ref 4.0–10.5)

## 2013-09-09 LAB — COMPREHENSIVE METABOLIC PANEL
ALT: 10 U/L (ref 0–53)
AST: 15 U/L (ref 0–37)
Albumin: 3.3 g/dL — ABNORMAL LOW (ref 3.5–5.2)
Alkaline Phosphatase: 75 U/L (ref 39–117)
BILIRUBIN TOTAL: 0.2 mg/dL — AB (ref 0.3–1.2)
BUN: 9 mg/dL (ref 6–23)
CO2: 21 mEq/L (ref 19–32)
CREATININE: 0.81 mg/dL (ref 0.50–1.35)
Calcium: 8.6 mg/dL (ref 8.4–10.5)
Chloride: 110 mEq/L (ref 96–112)
GFR calc Af Amer: >90 mL/min (ref >90)
GFR calc non Af Amer: >90 mL/min (ref >90)
Glucose, Bld: 120 mg/dL — ABNORMAL HIGH (ref 70–99)
POTASSIUM: 3.9 meq/L (ref 3.7–5.3)
Sodium: 146 mEq/L (ref 137–147)
Total Protein: 6.7 g/dL (ref 6.0–8.3)

## 2013-09-09 LAB — CBG MONITORING, ED: GLUCOSE-CAPILLARY: 118 mg/dL — AB (ref 70–99)

## 2013-09-09 LAB — ETHANOL: ALCOHOL ETHYL (B): 106 mg/dL — AB (ref 0–11)

## 2013-09-09 MED ORDER — NALOXONE HCL 1 MG/ML IJ SOLN: 1.0000 mg | INTRAMUSCULAR | Status: DC | PRN

## 2013-09-09 MED ORDER — NALOXONE HCL 0.4 MG/ML IJ SOLN
0.4000 mg | Freq: Once | INTRAMUSCULAR | Status: AC
  Administered 2013-09-09: 0.4 mg via INTRAVENOUS
  Filled 2013-09-09: qty 1

## 2013-09-09 MED ORDER — SODIUM CHLORIDE 0.9 % IV SOLN
1000.0000 mL | Freq: Once | INTRAVENOUS | Status: AC
  Administered 2013-09-09: 1000 mL via INTRAVENOUS

## 2013-09-09 MED ORDER — THIAMINE HCL 100 MG/ML IJ SOLN
100.0000 mg | Freq: Every day | INTRAMUSCULAR | Status: DC
  Administered 2013-09-09: 100 mg via INTRAVENOUS
  Filled 2013-09-09: qty 2

## 2013-09-09 NOTE — ED Notes (Signed)
Will attempt to draw labs and get urine sample again. Pt fighting and yelling at [employer] and EMTs

## 2013-09-09 NOTE — ED Notes (Signed)
Pt knows that urine is needed. Pt states that he is unable to void at this time.

## 2013-09-09 NOTE — ED Provider Notes (Signed)
CSN: [id number]     Arrival date & time 09/09/13  [id number] History   First MD Initiated Contact with Patient 09/09/13 [phone]     Chief Complaint  Patient presents with  . Altered Mental Status     (Consider location/radiation/quality/duration/timing/severity/associated sxs/prior Treatment) HPI This is an unidentified male BIB [employer] and EMS for ams. The patient was found unconscious on the side of [address]. He has a strong smell of alcohol. Protecting his airway, snoring. Patient covered in glitter. CBG 1:15. No obvious signs of trauma.  History reviewed. No pertinent past medical history. History reviewed. No pertinent past surgical history. No family history on file. History  Substance Use Topics  . Smoking status: Unknown If Ever Smoked  . Smokeless tobacco: Not on file  . Alcohol Use: Not on file   OB History   Grav Para Term Preterm Abortions TAB SAB Ect Mult Living                 Review of Systems  Unable to perform ROS: Patient unresponsive      Allergies  Review of patient's allergies indicates not on file.  Home Medications   Prior to Admission medications   Not on File   BP 100/58  Pulse 92  Temp(Src) 97.7 F (36.5 C) (Axillary)  SpO2 100% Physical Exam  Nursing note and vitals reviewed. Constitutional: He appears well-developed and well-nourished. No distress.  HENT:  Head: Normocephalic and atraumatic.  Mouth/Throat: Oropharynx is clear and moist.  Eyes: Conjunctivae are normal.  R corneal opacification. Meiotic L pupil. Minimally responsive to light  Neck: Normal range of motion. No JVD present.  Pulmonary/Chest: Effort normal and breath sounds normal. No respiratory distress. He has no wheezes.  Mild snoring respirations  Abdominal: Soft. He exhibits no distension and no mass. There is no guarding.  Musculoskeletal: Normal range of motion. He exhibits no edema and no tenderness.  Neurological: He is unresponsive. GCS eye subscore is 2. GCS  verbal subscore is 2. GCS motor subscore is 5.  Skin: He is not diaphoretic.    ED Course  Procedures (including critical care time) Labs Review Labs Reviewed  CBG MONITORING, ED - Abnormal; Notable for the following:    Glucose-Capillary 118 (*)    All other components within normal limits  CBC WITH DIFFERENTIAL  URINE RAPID DRUG SCREEN (HOSP PERFORMED)  URINALYSIS, ROUTINE W REFLEX MICROSCOPIC  ETHANOL    Imaging Review No results found.   EKG Interpretation None     CRITICAL CARE Performed by: Arthor CaptainHarris, Abigail   Total critical care time: 30  Critical care time was exclusive of separately billable procedures and treating other patients.  Critical care was necessary to treat or prevent imminent or life-threatening deterioration.  Critical care was time spent personally by me on the following activities: development of treatment plan with patient and/or surrogate as well as nursing, discussions with consultants, evaluation of patient's response to treatment, examination of patient, obtaining history from patient or surrogate, ordering and performing treatments and interventions, ordering and review of laboratory studies, ordering and review of radiographic studies, pulse oximetry and re-evaluation of patient's condition.  MDM   Final diagnoses:  Altered mental status, unspecified altered mental status type  Alcohol intoxication, uncomplicated    7:16 AM BP 100/58  Pulse 92  Temp(Src) 97.7 F (36.5 C) (Axillary)  SpO2 100% Patient found unresponsive.  0.4 mg naloxone given without effect. Protecting airway. VSS wnl with soft pressures. Suspect etoh intoxication. CT  head negative. Work up pending.   In and out cath urine attempted patient awoke and became very angry. Patient became very angry. He awoke and told us his name. Attempt at cath was stopped. Patient is still clearly intoxicated. Elevated glucose. negative CT. CBC and cmp without acute abnormality. Patient  sleeping and receiving fluids.     I spoke with patient which is now alert and oriented. He denies any other medical conditions. Patient statea that he was drinking with friends last night. Denies any other drug use. The patient states that he was drinking with friends last night and does not know how he ended up asleep on the side of the road.  Patient has eloped form the ED .       Arthor CaptainAbigail Harris, PA-C 09/09/13 2229

## 2013-09-09 NOTE — ED Notes (Signed)
Unable to do in an out cath pt fighting us

## 2013-09-09 NOTE — ED Notes (Signed)
Pt was seen walking out of the ED entrance by staff. This RN was in with another pt at this time. Heather, EMT walked up to the bus stop and down street but pt was not seen. [employer] and security are aware and looking for pt was well. Pt took belongings with him. IV was still present in pts arm. No signs of removal in room.

## 2013-09-09 NOTE — ED Provider Notes (Signed)
Medical screening examination/treatment/procedure(s) were conducted as a shared visit with non-physician practitioner(s) and myself.  I personally evaluated the patient during the encounter.   EKG Interpretation None      33 yo male presenting unresponsive after being found on a sidewalk by a bystander.  On exam, GCS 2,4,5.  Normal respiratory effort.  Normal perfusion.  Disheveled appearance.  Suspect alcohol intoxication.    After observation in ED, pt sobered, ate, ambulated.  He then eloped from the ED.    Clinical Impression: 1. Altered mental status, unspecified altered mental status type   2. Alcohol intoxication, uncomplicated       Candyce ChurnJohn David Wofford III, MD 09/10/13 [phone]

## 2013-09-09 NOTE — ED Notes (Addendum)
PA at bedside. Pt awake and speaking to PA

## 2013-09-09 NOTE — ED Notes (Signed)
Patient presents to ED via [facility]. Patient was found laying on sidewalk by [employer]- patient is responsive to pain at this time. Unknown cause of current state. VSS. CBG 115. EKG unremarkable per EMS.

## 2013-09-10 ENCOUNTER — Telehealth: Payer: Self-pay

## 2013-09-10 NOTE — Telephone Encounter (Signed)
LMVM to CB. 

## 2013-09-10 NOTE — Telephone Encounter (Signed)
PT STATES HE WAS GIVEN A WRITTEN PRESCRIPTION FOR OXYCODONE AND IT GOT WET, NEVER HAD A CHANCE TO GET IT FILLED, NEED ANOTHER WRITTEN ONE PLEASE CALL PT AT [phone]

## 2013-09-10 NOTE — Telephone Encounter (Signed)
PATIENT CALLED BACK REGARDING PRESCRIPTION FOR OXYCODONE.

## 2013-09-10 NOTE — ED Provider Notes (Signed)
Medical screening examination/treatment/procedure(s) were conducted as a shared visit with non-physician practitioner(s) and myself.  I personally evaluated the patient during the encounter.   Please see my separate note.     Candyce ChurnJohn David Wofford III, MD 09/10/13 [phone]

## 2013-09-10 NOTE — Telephone Encounter (Signed)
**Note De-Identified Farhana Fellows Obfuscation** Called- home number did not work but [facility]. If he can bring in the "wet" paper rx I can see about exchanging it for a new one.  However I had thought that he was going to be out of town for several weeks which was why he needed his rx early.  If he is not able to bring in my last paper rx I will NOT be able to replace it.

## 2013-09-12 ENCOUNTER — Telehealth: Payer: Self-pay

## 2013-09-12 NOTE — Telephone Encounter (Signed)
Thanks to Dr. Patsy Lageropland for handling this, i wil be out of the office until 6/30 and pt is overdue for OV for as well if he wants to continue his management at our office.

## 2013-09-12 NOTE — Telephone Encounter (Signed)
PT STATES HE IS OUT OF ALL HIS MEDICINES WHICH IS VALIUM 5MG S, CYMBALTA 60MG S AND OXYCODONE HCI 10MG S PLEASE CALL [phone] AND HIS COUSIN WILL BE COMING TO PICK UP

## 2013-09-13 NOTE — Telephone Encounter (Signed)
LM on mobile unable to LM on home number to CB.

## 2013-09-13 NOTE — Telephone Encounter (Signed)
See message from 09/10/13 that addresses Oxy. Pt should have RFs left on Cymbalta Rx sent to [facility] [address] on 07/12/13 w/5RFs. Called pharm and advised he p/up last valium RF on 08/27/13 so he should not be out yet. Per Dr Shaw, pt needs to RTC for more RFs, so he should plan to get in before he runs out of the valium. LMOM w/sister to have pt CB.  

## 2013-09-14 NOTE — Telephone Encounter (Signed)
Duplicate messages- see call 6/24

## 2013-09-14 NOTE — Telephone Encounter (Signed)
See message from 09/10/13 that addresses Oxy. Pt should have RFs left [facility] w/5RFs. Called pharm and advised he p/up last valium RF on 08/27/13 so he should not be out yet. Per Dr Clelia CroftShaw, pt needs to RTC for more RFs, so he should plan to get in before he runs out of the valium. LMOM w/sister to have pt CB.

## 2013-09-15 NOTE — Telephone Encounter (Signed)
Left message on machine to call back on cell number. Home number not working.

## 2013-09-16 NOTE — Telephone Encounter (Signed)
Left message on machine to call back on cell number. Home number not working.  

## 2013-09-16 NOTE — Telephone Encounter (Signed)
Letter sent.

## 2013-09-22 ENCOUNTER — Other Ambulatory Visit: Payer: Self-pay | Admitting: Family Medicine

## 2013-09-23 ENCOUNTER — Encounter: Payer: Self-pay | Admitting: Family Medicine

## 2013-09-23 ENCOUNTER — Telehealth: Payer: Self-pay

## 2013-09-23 ENCOUNTER — Other Ambulatory Visit: Payer: Self-pay | Admitting: Family Medicine

## 2013-09-23 DIAGNOSIS — F10129 Alcohol abuse with intoxication, unspecified: Secondary | ICD-10-CM | POA: Insufficient documentation

## 2013-09-23 NOTE — Telephone Encounter (Signed)
See other phone message  

## 2013-09-23 NOTE — Telephone Encounter (Signed)
Dr. Clelia CroftShaw, pt wanted me to let you know that he has an appt with Dr. Patsy Lageropland on 10/01/13 and wants to know if we can RF until then. 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

## 2013-09-23 NOTE — Telephone Encounter (Signed)
In my opinion we need to discharge Mr. Cody Sims at this point. He lied to me about his reason for needing an early RF of oxycodone, and his behavior is dangerous.  I am not willing to continue to prescribe any controlled substances after reviewing his recent ED note.  He is in danger of OD and death.

## 2013-09-23 NOTE — Telephone Encounter (Signed)
Pt says he needs to speak with Dr Clelia CroftShaw. He says that she knows what it is about.

## 2013-09-23 NOTE — Telephone Encounter (Signed)
No refills w/o OV.  Also, will need to stop valium. Pt was brought in to ER by EMS when he was found passed out [address] covered in glitter. Pt was clearly intoxicated and only awoke in the ER during attempted cath, left AMA prior to release. This was during the time that pt stated he was going to be out of town on work and so had asked for an early refill on his pain medication a wk earlier.  Alcohol use is contraind w/ prescribed pain meds, esp benzos - the valium could lead to respiratory suppression and unintentional overdose so it is unlikely I will prescribe this to him anymore at all but he is still overdue for an OV to discuss this and his other medical problems.

## 2013-10-01 ENCOUNTER — Telehealth: Payer: Self-pay | Admitting: Family Medicine

## 2013-10-01 ENCOUNTER — Ambulatory Visit: Payer: Self-pay | Admitting: Family Medicine

## 2013-10-01 NOTE — Telephone Encounter (Signed)
I agree with Dr. Patsy Lageropland. I am happy to see pt for other medical problems but he has been repeadted non-compliant with his controlled substance contract as well as rude and demanding of our staff.  He has not showed up to 3 scheduled appointments (not including those he has cancelled at the last minute).  We can refer him to a pain clinic if pt wishes but will not be refilling any further controlled substances.  It is clearly unsafe and a great risk to him to continue.

## 2013-10-01 NOTE — Telephone Encounter (Signed)
Am pt's PCP - agree w/ discharging pt.

## 2013-10-01 NOTE — Telephone Encounter (Signed)
Called and LMOM. I see that he is on my schedule for today. I am glad to see him if he wishes, but will no longer be rx any controlled substances for him.   He had asked me for an early, post- dated oxycodone rx on 6/13 because he stated that he would be out of town for several weeks working. However he was then brought into the ED on 6/21 by police/EMS after being found unresponsive on the street, intoxicated.   I will not be writing any more controlled substances for this pt, and it is my opinion that he needs to be dismissed from our practice.  He was dishonest with me regarding his plans to be out of town and I also feel he is in danger of overdose and death from controlled substances and alcohol. I defer this decision to his PCP Dr. Clelia CroftShaw.

## 2013-10-02 ENCOUNTER — Telehealth: Payer: Self-pay

## 2013-10-02 NOTE — Telephone Encounter (Signed)
I spoke with Mack GuiseLoy on the phone.  Explained that we will no longer rx any controlled substances for him, and that he will received a discharge letter soon- we can see him for emergencies only until he finds a new MD.  Explained that he is discharged due to missing multiple appointments, but also because I feel he was not truthful with me regarding his plans to leave town and thus his need for early oxycodone rx. He stated understanding

## 2013-10-02 NOTE — Telephone Encounter (Signed)
Will send formal discharge letter to patient.

## 2013-10-02 NOTE — Telephone Encounter (Signed)
See other phone message. Pt is being dismissed from the practice.

## 2013-10-02 NOTE — Telephone Encounter (Signed)
Pt's cousin came in to pick up Orley's medication.  I did not see the medication on file and then asked clinical about it.  Denny Peonrin knew immediately about the situation.  I told the cousin that he was not on Jarelle's hippa form, that I could not give him any information and that Mack GuiseLoy would have to call.  Around 8 p.m. The cousin called again inquiring on the medicine to see if it was ready to pick up.  I told him I was the one that spoke to him when he came in and that Mack GuiseLoy would have to call.  He tried to tell me that Mack GuiseLoy had called, but there is no documentation for that.

## 2013-10-20 ENCOUNTER — Ambulatory Visit (INDEPENDENT_AMBULATORY_CARE_PROVIDER_SITE_OTHER): Payer: Self-pay | Admitting: Family Medicine

## 2013-10-20 VITALS — BP 126/72 | HR 94 | Temp 98.6°F | Resp 18 | Ht 66.0 in | Wt 143.0 lb

## 2013-10-20 DIAGNOSIS — G894 Chronic pain syndrome: Secondary | ICD-10-CM

## 2013-10-20 NOTE — Progress Notes (Signed)
Pt informed that I am not willing to prescribe him any more controlled substances.  He did not understand why. I informed him that he had "no showed" in 4 visits for me - did not even bother to call an hour before and cancel, was rude to our clerical staff, repeatedly asked for early refills, requested to replace lost rxs, and was in the ER after binge drinking.  Pt denied all of these accusations but was informed that I would see him for other reasons but no more controlled substances here. He requested a medication list and referral to a pain clinic which was placed. Pt requested refills on oxycodone to last him until he got in with the pain clinic and these were denied. Pt left peacefully and agreeably.

## 2013-10-20 NOTE — Patient Instructions (Signed)
You have been referred to Pain management.  We will try to get you into  [facility] as they can often get you in the quickest [address] [place], [place] [phone] [email or web address]

## 2013-10-21 ENCOUNTER — Telehealth: Payer: Self-pay | Admitting: Family Medicine

## 2013-10-21 DIAGNOSIS — G894 Chronic pain syndrome: Secondary | ICD-10-CM

## 2013-10-21 NOTE — Telephone Encounter (Signed)
The patient called to ask about receiving medication refills for three of his prescriptions: diazepam (VALIUM) 5 MG tablet, DULoxetine (CYMBALTA) 60 MG capsule, and Multiple Vitamin (MULTIVITAMIN) tablet.  He requested to receive a call back at [phone].

## 2013-10-22 NOTE — Telephone Encounter (Signed)
No, we have discharged Cody Sims so no refills on controlled substances or pain meds.  Fine to refill the multivitamin.

## 2013-10-23 NOTE — Telephone Encounter (Signed)
Attempted to leave message. Unable to leave message.

## 2013-10-23 NOTE — Telephone Encounter (Signed)
LM for pt to rtn call. 

## 2013-10-23 NOTE — Telephone Encounter (Signed)
Mr. Cody Sims called back to inquire about his referral to pain management. I"ve been advised that he has been dismissed from our practice and that the clinical TL will determine what we can do for Mr. Cody Sims.

## 2013-10-24 NOTE — Telephone Encounter (Signed)
Mr Sedalia MutaCox is very angry with our office. Spent about 20 minutes on the phone with patient yelling that we have not done our job getting him into a pain management clinic so he wants to know what Dr. Clelia CroftShaw is willing to do so he can work. He yells that he is not able to work without this medication. He works out of town Contract[place] apologizes for his cousins behavior but does not think we are practicing good medical if we take that out on him. I tried calmly to advise pt that we have discharged pt due to his history of missed appointments with Dr. Clelia CroftShaw and his own verbal abuse to the staff here. Pt began yelling at me again. Pt asked when Dr. Clelia CroftShaw would be in the office and demands she be the one to call him back. Advised pt she would be in this evening. Pt hung up the phone on me.

## 2013-10-25 NOTE — Telephone Encounter (Signed)
Unfortunately, pt's verbal abuse of our valuable staff only makes me even less inclined to continue his narcotics. If he wants to come in to the office again for me to tell him this that is fine but he has been informed multiple times that he has been discharged and we will no longer prescribe him narcotics. It may take sev wks to get pain management appt and that is standard. He no showed a pain management appt a yr prev when referred him to [facility].

## 2013-10-30 ENCOUNTER — Telehealth: Payer: Self-pay

## 2013-10-30 NOTE — Telephone Encounter (Signed)
Tried to give pt information below. Pt started arguing with his wife because she would not write down the information

## 2013-10-30 NOTE — Telephone Encounter (Signed)
Continuation of previous message: Tried to give pt information below. Pt started arguing with his wife because she would not write down the information, he started arguing with wife and the line went dead.  Pt can contact any office to schedule an appt due to not needing a referral.  [facility] [phone]  [facility] [phone]  [facility]  [phone]  [facility] [phone]   Pain Management [phone]

## 2013-10-30 NOTE — Telephone Encounter (Signed)
Dr. Clelia CroftShaw   Patient states the pain manangement clinic is $600 and he has no insurance.  Wants to know if he can find his own place.   Please call to discuss with him.   [phone]

## 2013-11-06 ENCOUNTER — Encounter (HOSPITAL_COMMUNITY): Payer: Self-pay | Admitting: Emergency Medicine

## 2013-11-06 ENCOUNTER — Telehealth: Payer: Self-pay

## 2013-11-06 ENCOUNTER — Emergency Department (HOSPITAL_COMMUNITY)
Admission: EM | Admit: 2013-11-06 | Discharge: 2013-11-06 | Disposition: A | Discharge status: Home / Self Care | Payer: Self-pay | Attending: Emergency Medicine | Admitting: Emergency Medicine

## 2013-11-06 DIAGNOSIS — Z79899 Other long term (current) drug therapy: Secondary | ICD-10-CM | POA: Insufficient documentation

## 2013-11-06 DIAGNOSIS — IMO0001 Reserved for inherently not codable concepts without codable children: Secondary | ICD-10-CM | POA: Insufficient documentation

## 2013-11-06 DIAGNOSIS — M79609 Pain in unspecified limb: Secondary | ICD-10-CM | POA: Insufficient documentation

## 2013-11-06 DIAGNOSIS — G894 Chronic pain syndrome: Secondary | ICD-10-CM | POA: Insufficient documentation

## 2013-11-06 DIAGNOSIS — F17200 Nicotine dependence, unspecified, uncomplicated: Secondary | ICD-10-CM | POA: Insufficient documentation

## 2013-11-06 DIAGNOSIS — M546 Pain in thoracic spine: Secondary | ICD-10-CM | POA: Insufficient documentation

## 2013-11-06 DIAGNOSIS — M549 Dorsalgia, unspecified: Secondary | ICD-10-CM | POA: Insufficient documentation

## 2013-11-06 HISTORY — DX: Other chronic pain: G89.29

## 2013-11-06 MED ORDER — DIAZEPAM 5 MG PO TABS: 5.0000 mg | ORAL_TABLET | Freq: Two times a day (BID) | ORAL | Status: DC | PRN

## 2013-11-06 MED ORDER — DULOXETINE HCL 60 MG PO CPEP: 60.0000 mg | ORAL_CAPSULE | Freq: Every day | ORAL | Status: DC

## 2013-11-06 MED ORDER — OXYCODONE HCL 5 MG PO TABS: 5.0000 mg | ORAL_TABLET | Freq: Four times a day (QID) | ORAL | Status: DC | PRN

## 2013-11-06 NOTE — Discharge Instructions (Signed)
Chronic Pain Chronic pain can be defined as pain that is off and on and lasts for 3-6 months or longer. Many things cause chronic pain, which can make it difficult to make a diagnosis. There are many treatment options available for chronic pain. However, finding a treatment that works well for you may require trying various approaches until the right one is found. Many people benefit from a combination of two or more types of treatment to control their pain. SYMPTOMS  Chronic pain can occur anywhere in the body and can range from mild to very severe. Some types of chronic pain include:  Headache.  Low back pain.  Cancer pain.  Arthritis pain.  Neurogenic pain. This is pain resulting from damage to nerves. People with chronic pain may also have other symptoms such as:  Depression.  Anger.  Insomnia.  Anxiety. DIAGNOSIS  Your health care provider will help diagnose your condition over time. In many cases, the initial focus will be on excluding possible conditions that could be causing the pain. Depending on your symptoms, your health care provider may order tests to diagnose your condition. Some of these tests may include:   Blood tests.   CT scan.   MRI.   X-rays.   Ultrasounds.   Nerve conduction studies.  You may need to see a specialist.  TREATMENT  Finding treatment that works well may take time. You may be referred to a pain specialist. He or she may prescribe medicine or therapies, such as:   Mindful meditation or yoga.  Shots (injections) of numbing or pain-relieving medicines into the spine or area of pain.  Local electrical stimulation.  Acupuncture.   Massage therapy.   Aroma, color, light, or sound therapy.   Biofeedback.   Working with a physical therapist to keep from getting stiff.   Regular, gentle exercise.   Cognitive or behavioral therapy.   Group support.  Sometimes, surgery may be recommended.  HOME CARE INSTRUCTIONS    Take all medicines as directed by your health care provider.   Lessen stress in your life by relaxing and doing things such as listening to calming music.   Exercise or be active as directed by your health care provider.   Eat a healthy diet and include things such as vegetables, fruits, fish, and lean meats in your diet.   Keep all follow-up appointments with your health care provider.   Attend a support group with others suffering from chronic pain. SEEK MEDICAL CARE IF:   Your pain gets worse.   You develop a new pain that was not there before.   You cannot tolerate medicines given to you by your health care provider.   You have new symptoms since your last visit with your health care provider.  SEEK IMMEDIATE MEDICAL CARE IF:   You feel weak.   You have decreased sensation or numbness.   You lose control of bowel or bladder function.   Your pain suddenly gets much worse.   You develop shaking.  You develop chills.  You develop confusion.  You develop chest pain.  You develop shortness of breath.  MAKE SURE YOU:  Understand these instructions.  Will watch your condition.  Will get help right away if you are not doing well or get worse. Document Released: 11/28/2001 Document Revised: 11/08/2012 Document Reviewed: 09/01/2012 [facility] Patient Information 2015 [place], [place]. This information is not intended to replace advice given to you by your health care provider. Make sure you discuss any  questions you have with your health care provider.  We are not able to provide long-term medication management for chronic pain..  You have been provided prescriptions for limited amounts of your medication. You will need to follow-up with the pain management clinic as discussed.  Chronic Pain Discharge Instructions  Emergency care providers appreciate that many patients coming to us are in severe pain and we wish to address their pain in the safest, most  responsible manner.  It is important to recognize however, that the proper treatment of chronic pain differs from that of the pain of injuries and acute illnesses.  Our goal is to provide quality, safe, personalized care and we thank you for giving us the opportunity to serve you. The use of narcotics and related agents for chronic pain syndromes may lead to additional physical and psychological problems.  Nearly as many people die from prescription narcotics each year as die from car crashes.  Additionally, this risk is increased if such prescriptions are obtained from a variety of sources.  Therefore, only your primary care physician or a pain management specialist is able to safely treat such syndromes with narcotic medications long-term.    Documentation revealing such prescriptions have been sought from multiple sources may prohibit us from providing a refill or different narcotic medication.  Your name may be checked first through the [facility] Reporting System.  This database is a record of controlled substance medication prescriptions that the patient has received.  This has been established by [facility] in an effort to eliminate the dangerous, and often life threatening, practice of obtaining multiple prescriptions from different medical providers.   If you have a chronic pain syndrome (i.e. chronic headaches, recurrent back or neck pain, dental pain, abdominal or pelvis pain without a specific diagnosis, or neuropathic pain such as fibromyalgia) or recurrent visits for the same condition without an acute diagnosis, you may be treated with non-narcotics and other non-addictive medicines.  Allergic reactions or negative side effects that may be reported by a patient to such medications will not typically lead to the use of a narcotic analgesic or other controlled substance as an alternative.   Patients managing chronic pain with a personal physician should have  provisions in place for breakthrough pain.  If you are in crisis, you should call your physician.  If your physician directs you to the emergency department, please have the doctor call and speak to our attending physician concerning your care.   When patients come to the Emergency Department (ED) with acute medical conditions in which the Emergency Department physician feels appropriate to prescribe narcotic or sedating pain medication, the physician will prescribe these in very limited quantities.  The amount of these medications will last only until you can see your primary care physician in his/her office.  Any patient who returns to the ED seeking refills should expect only non-narcotic pain medications.   In the event of an acute medical condition exists and the emergency physician feels it is necessary that the patient be given a narcotic or sedating medication -  a responsible adult driver should be present in the room prior to the medication being given by the nurse.   Prescriptions for narcotic or sedating medications that have been lost, stolen or expired will not be refilled in the Emergency Department.    Patients who have chronic pain may receive non-narcotic prescriptions until seen by their primary care physician.  It is every patients personal responsibility to  maintain active prescriptions with his or her primary care physician or specialist.

## 2013-11-06 NOTE — Telephone Encounter (Signed)
Patient came by upset because he wanted his records from his old prescriptions and [facility] to just come by and we will give it to him. I spoke to Cindie LarocheSarah H. And we agreed that he needs to follow protocol like every patient and fill out a release to get records and wait till medical records is complete.

## 2013-11-06 NOTE — Telephone Encounter (Signed)
Looked in pick up drawer to see if someone did do a medical records release for him and there was only a med list. I called patient to tell him that but his wife answered and I told her to have him give us a call back if this is all he wanted and not the actual records.

## 2013-11-06 NOTE — ED Provider Notes (Signed)
CSN: [id number]     Arrival date & time 11/06/13  1450 History  This chart was scribed for non-physician practitioner, Felicie Morn, NP working with Richardean Canal, MD by Greggory Stallion, ED scribe. This patient was seen in room TR09C/TR09C and the patient's care was started at 4:54 PM.    Chief Complaint  Patient presents with  . Back Pain   The history is provided by the patient. No language interpreter was used.   HPI Comments: Cody Sims. is a 33 y.o. male who presents to the Emergency Department complaining of chronic upper back pain and bilateral arm pain, left worse than right, that worsened 3-4 days ago when he ran out of his pain medications. Laying down worsens the pain. Pt was discharged from Dr. Alver Fisher practice because his wife got into it with the doctor. He states he has a pain management appointment but has to get the money for it first. Pt has taken valium, oxycodone and cymbalta in the past for pain with relief. States he has also been on prednisone intermittently. He has seen a neurosurgeon in the past and told he needed surgery.   Past Medical History  Diagnosis Date  . Chronic pain    History reviewed. No pertinent past surgical history. Family History  Problem Relation Age of Onset  . Arthritis Mother     cervical DDD req anterior fusion  . Arthritis Father    History  Substance Use Topics  . Smoking status: Current Every Day Smoker  . Smokeless tobacco: Not on file  . Alcohol Use: No    Review of Systems  Musculoskeletal: Positive for back pain and myalgias.  All other systems reviewed and are negative.  Allergies  Review of patient's allergies indicates no known allergies.  Home Medications   Prior to Admission medications   Medication Sig Start Date End Date Taking? Authorizing Provider  diazepam (VALIUM) 5 MG tablet Take 5 mg by mouth daily as needed for anxiety. 07/25/13  Yes Gwenlyn Found Copland, MD  DULoxetine (CYMBALTA) 60 MG capsule Take 1 capsule (60 mg  total) by mouth daily. 07/12/13  Yes Gwenlyn Found Copland, MD  Multiple Vitamin (MULTIVITAMIN) tablet Take 1 tablet by mouth daily.   Yes Historical Provider, MD  Oxycodone HCl 10 MG TABS Take 10 mg by mouth every 6 (six) hours as needed (pain). May fill on 09/14/2013 09/02/13  Yes Heather M Marte, PA-C   BP 141/80  Pulse 108  Temp(Src) 98.9 F (37.2 C) (Oral)  Resp 18  SpO2 98%  Physical Exam  Nursing note and vitals reviewed. Constitutional: He is oriented to person, place, and time. He appears well-developed and well-nourished. No distress.  HENT:  Head: Normocephalic and atraumatic.  Eyes: Conjunctivae and EOM are normal.  Neck: Neck supple. No tracheal deviation present.  Cardiovascular: Normal rate, regular rhythm and normal heart sounds.   Pulmonary/Chest: Effort normal and breath sounds normal. No respiratory distress. He has no wheezes. He has no rhonchi. He has no rales.  Musculoskeletal: Normal range of motion.  No midline spine tenderness. Strength normal.   Neurological: He is alert and oriented to person, place, and time.  Skin: Skin is warm and dry.  Psychiatric: He has a normal mood and affect. His behavior is normal.    ED Course  Procedures (including critical care time)  DIAGNOSTIC STUDIES: Oxygen Saturation is 98% on RA, normal by my interpretation.    COORDINATION OF CARE: 4:58 PM-Discussed treatment plan which includes  short term prescriptions on pain medications with pt at bedside and pt agreed to plan.   Labs Review Labs Reviewed - No data to display  Imaging Review No results found.   EKG Interpretation None     Patient provided with prescription for limited amounts of chronic pain medications.  Discussed with patient he will need to re-establish a relationship with a pain management provider. MDM   Final diagnoses:  Chronic back pain      I personally performed the services described in this documentation, which was scribed in my presence.  The recorded information has been reviewed and is accurate.  Jimmye Normanavid John Kyerra Vargo, NP 11/07/13 [phone]

## 2013-11-06 NOTE — ED Notes (Signed)
Pt c/o chronic upper back pain x 1 year; pt sts out of home meds and discharged from [facility] practice and can not afford to see pain management; pt sts last pain meds 3-4 days ago

## 2013-11-07 NOTE — ED Provider Notes (Signed)
Medical screening examination/treatment/procedure(s) were performed by non-physician practitioner and as supervising physician I was immediately available for consultation/collaboration.   EKG Interpretation None        Maryan Sivak H Jaysen Wey, MD 11/07/13 1037 

## 2013-11-08 NOTE — Telephone Encounter (Addendum)
Patient came in today and picked up his records and med list. He repeatedly walked in and out of the building, reviewing his records, etc.

## 2013-11-15 ENCOUNTER — Encounter (HOSPITAL_COMMUNITY): Payer: Self-pay | Admitting: Emergency Medicine

## 2013-11-15 ENCOUNTER — Emergency Department (HOSPITAL_COMMUNITY)
Admission: EM | Admit: 2013-11-15 | Discharge: 2013-11-15 | Disposition: A | Discharge status: Home / Self Care | Payer: Self-pay | Attending: Emergency Medicine | Admitting: Emergency Medicine

## 2013-11-15 DIAGNOSIS — M542 Cervicalgia: Secondary | ICD-10-CM | POA: Insufficient documentation

## 2013-11-15 DIAGNOSIS — F17200 Nicotine dependence, unspecified, uncomplicated: Secondary | ICD-10-CM | POA: Insufficient documentation

## 2013-11-15 DIAGNOSIS — Z79899 Other long term (current) drug therapy: Secondary | ICD-10-CM | POA: Insufficient documentation

## 2013-11-15 DIAGNOSIS — Z760 Encounter for issue of repeat prescription: Secondary | ICD-10-CM | POA: Insufficient documentation

## 2013-11-15 DIAGNOSIS — M549 Dorsalgia, unspecified: Secondary | ICD-10-CM | POA: Insufficient documentation

## 2013-11-15 DIAGNOSIS — G8921 Chronic pain due to trauma: Secondary | ICD-10-CM | POA: Insufficient documentation

## 2013-11-15 DIAGNOSIS — G8929 Other chronic pain: Secondary | ICD-10-CM

## 2013-11-15 MED ORDER — DIAZEPAM 5 MG PO TABS: 5.0000 mg | ORAL_TABLET | Freq: Two times a day (BID) | ORAL | Status: DC | PRN

## 2013-11-15 MED ORDER — OXYCODONE HCL 5 MG PO TABS: 5.0000 mg | ORAL_TABLET | Freq: Four times a day (QID) | ORAL | Status: DC | PRN

## 2013-11-15 NOTE — ED Notes (Signed)
The patient said he has been referred to a pain clinic and the first visit costs $600.00 for the first visit and he does not have insurance.  He is here to get a refill for his prescritption.

## 2013-11-15 NOTE — ED Provider Notes (Signed)
CSN: [id number]     Arrival date & time 11/15/13  1717 History   First MD Initiated Contact with Patient 11/15/13 1755     Chief Complaint  Patient presents with  . Medication Refill    The patient said he has been referred to a pain clinic and the first visit costs $600.00 for the first visit and he does not have insurance.  He is here to get a refill for his prescritption.     (Consider location/radiation/quality/duration/timing/severity/associated sxs/prior Treatment) HPI Comments: 64m presents requesting refill of chronic pain meds.  He has chronic neck and back pain.  He has initial pain management appt set up for 18 days from now, was not able to go yet due to cost.  No recent change in his pain.  Says he feels very bad about coming here but cannot work without his pain medications and cannot go to pain management if he cant work.  No new sxs.     Past Medical History  Diagnosis Date  . Chronic pain    History reviewed. No pertinent past surgical history. Family History  Problem Relation Age of Onset  . Arthritis Mother     cervical DDD req anterior fusion  . Arthritis Father    History  Substance Use Topics  . Smoking status: Current Every Day Smoker  . Smokeless tobacco: Not on file  . Alcohol Use: No    Review of Systems  Musculoskeletal: Positive for back pain and neck pain.  Neurological: Negative for weakness and numbness.  All other systems reviewed and are negative.     Allergies  Review of patient's allergies indicates no known allergies.  Home Medications   Prior to Admission medications   Medication Sig Start Date End Date Taking? Authorizing Provider  DULoxetine (CYMBALTA) 60 MG capsule Take 1 capsule (60 mg total) by mouth daily. 07/12/13  Yes Gwenlyn Found Copland, MD  Multiple Vitamin (MULTIVITAMIN) tablet Take 1 tablet by mouth daily.   Yes Historical Provider, MD  Oxycodone HCl 10 MG TABS Take 10 mg by mouth every 6 (six) hours as needed (pain). May  fill on 09/14/2013 09/02/13  Yes Heather M Marte, PA-C  diazepam (VALIUM) 5 MG tablet Take 1 tablet (5 mg total) by mouth every 12 (twelve) hours as needed for anxiety. 11/15/13   Graylon Good, PA-C  oxyCODONE (OXY IR/ROXICODONE) 5 MG immediate release tablet Take 1 tablet (5 mg total) by mouth every 6 (six) hours as needed for moderate pain or severe pain. 11/15/13   Adrian Blackwater Alixandrea Milleson, PA-C   BP 130/87  Pulse 102  Temp(Src) 98.8 F (37.1 C) (Oral)  Resp 15  SpO2 97% Physical Exam  Nursing note and vitals reviewed. Constitutional: He is oriented to person, place, and time. He appears well-developed and well-nourished. No distress.  HENT:  Head: Normocephalic.  Pulmonary/Chest: Effort normal. No respiratory distress.  Neurological: He is alert and oriented to person, place, and time. Coordination normal.  Skin: Skin is warm and dry. No rash noted. He is not diaphoretic.  Psychiatric: He has a normal mood and affect. Judgment normal.    ED Course  Procedures (including critical care time) Labs Review Labs Reviewed - No data to display  Imaging Review No results found.   EKG Interpretation None      MDM   Final diagnoses:  Chronic pain    Reviewed controlled substances monitoring system, it is appropriate   Discussed with the pt that this will NOT  be done again, he must follow up with pain management or find a new PCP.     Meds ordered this encounter  Medications  . DISCONTD: oxyCODONE (OXY IR/ROXICODONE) 5 MG immediate release tablet    Sig: Take 5 mg by mouth every 6 (six) hours as needed for moderate pain or severe pain.  Marland Kitchen oxyCODONE (OXY IR/ROXICODONE) 5 MG immediate release tablet    Sig: Take 1 tablet (5 mg total) by mouth every 6 (six) hours as needed for moderate pain or severe pain.    Dispense:  30 tablet    Refill:  0    Order Specific Question:  Supervising Provider    Answer:  Lorenz Coaster, DAVID C [id number]  . diazepam (VALIUM) 5 MG tablet    Sig: Take 1 tablet  (5 mg total) by mouth every 12 (twelve) hours as needed for anxiety.    Dispense:  30 tablet    Refill:  0    Order Specific Question:  Supervising Provider    Answer:  Lorenz Coaster, DAVID C [[id number]]     Graylon Good, PA-C 11/15/13 [phone]

## 2013-11-15 NOTE — Discharge Instructions (Signed)

## 2013-11-16 NOTE — ED Provider Notes (Signed)
Medical screening examination/treatment/procedure(s) were performed by non-physician practitioner and as supervising physician I was immediately available for consultation/collaboration.  Leslee Home, M.D.  Reuben Likes, MD 11/16/13 [phone]

## 2013-11-18 IMAGING — CR DG ORBITS FOR FOREIGN BODY
2 series · 2 of 2 positions shown · non-contrast
Comparison: None.

CLINICAL DATA: History of metal removed from eyes previously, pre
MRI

ORBITS FOR FOREIGN BODY - 2 VIEW

[view not recorded (1 of 2)]
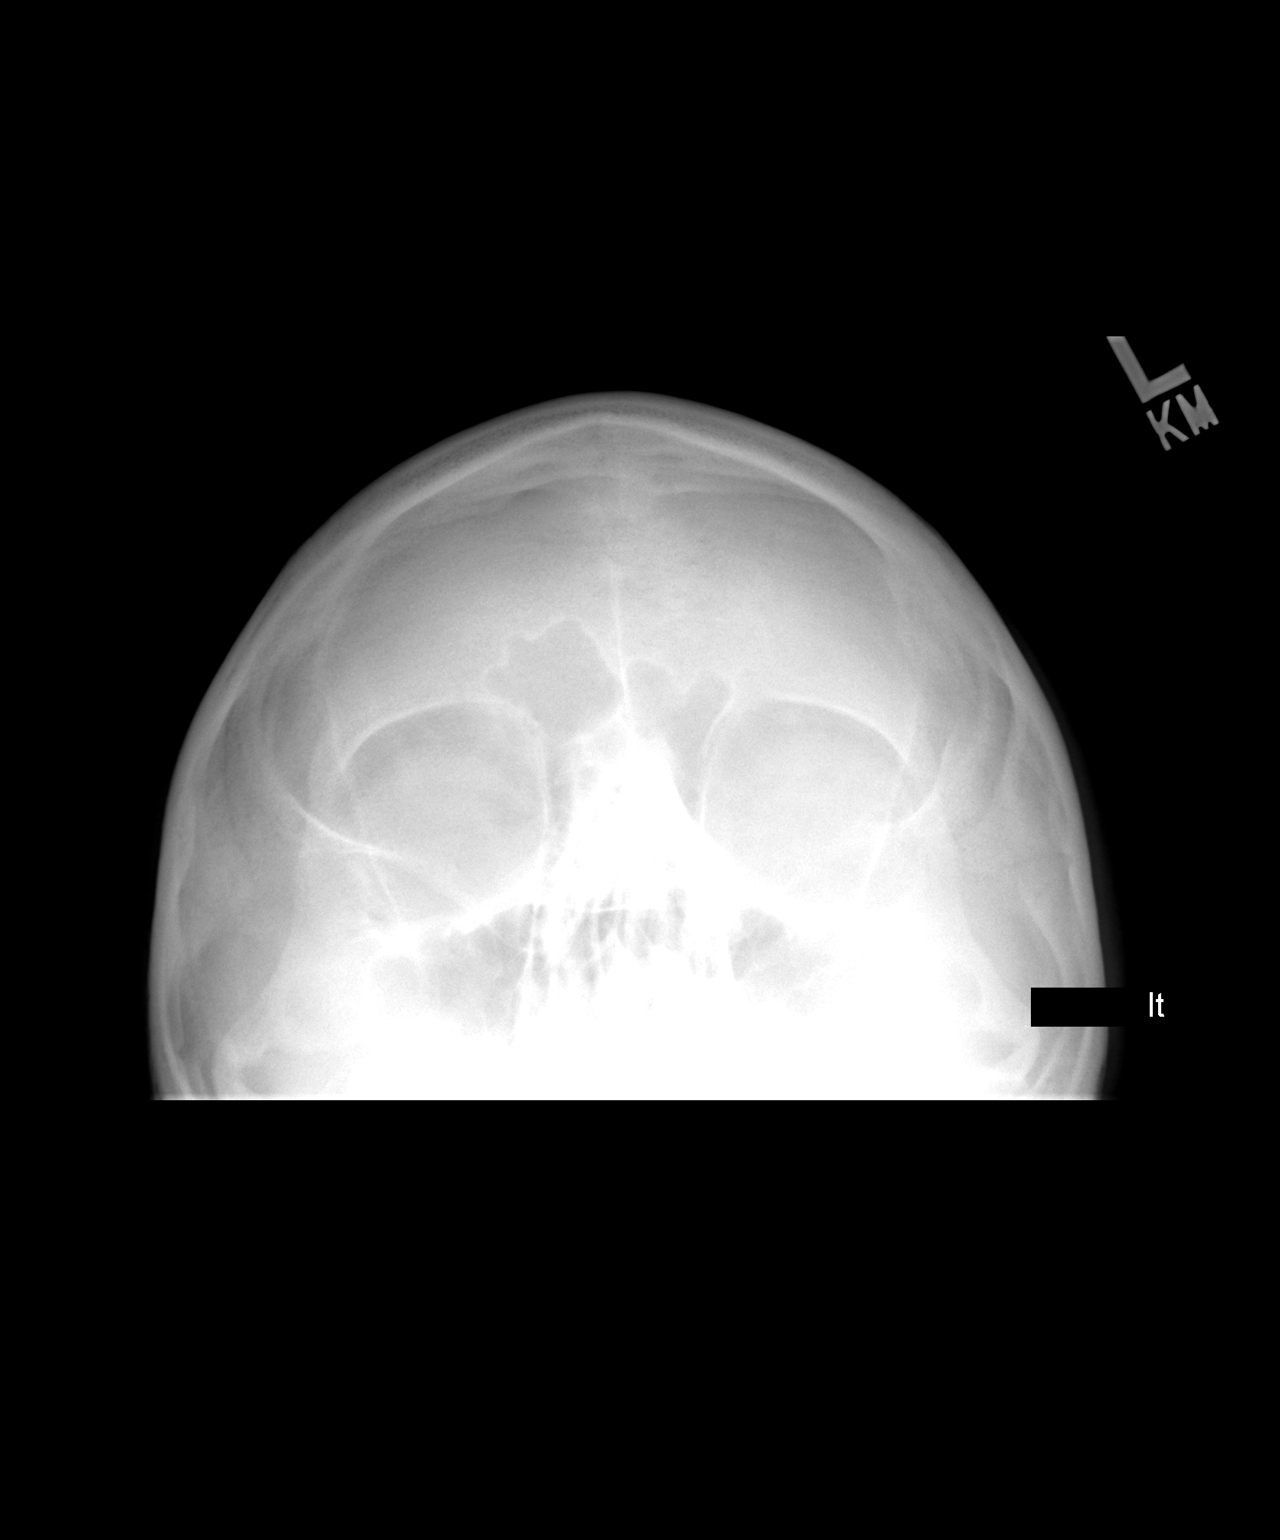

[view not recorded (2 of 2)]
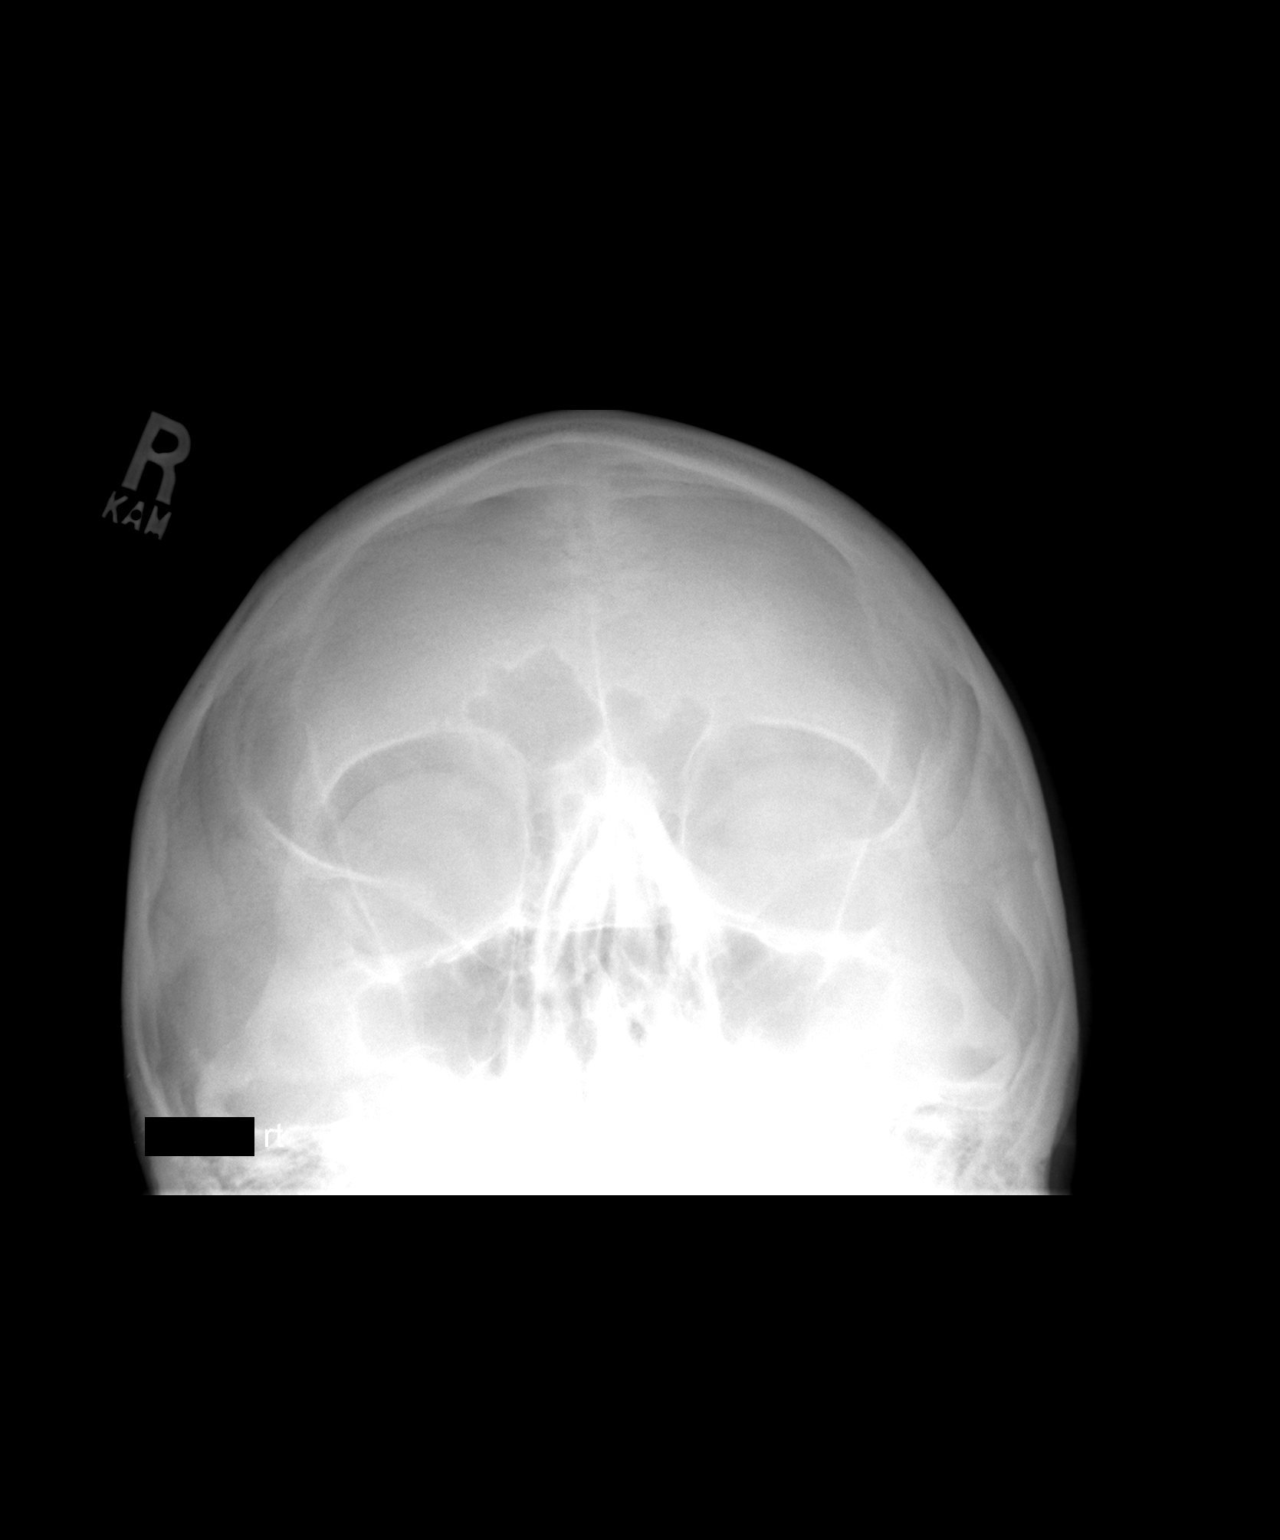

[2 of 2 positions shown; findings below may reference images not displayed]

FINDINGS: Views of the orbits were obtained with the patient
looking to the left and looking to the right.  No orbital metallic
foreign body is seen.  The paranasal sinuses that are visualized
are clear.
IMPRESSION: No orbital metallic foreign body.

## 2014-01-03 ENCOUNTER — Encounter (HOSPITAL_COMMUNITY): Payer: Self-pay | Admitting: Emergency Medicine

## 2014-01-03 ENCOUNTER — Emergency Department (HOSPITAL_COMMUNITY)
Admission: EM | Admit: 2014-01-03 | Discharge: 2014-01-03 | Disposition: A | Discharge status: Home / Self Care | Payer: Self-pay | Attending: Emergency Medicine | Admitting: Emergency Medicine

## 2014-01-03 DIAGNOSIS — Z720 Tobacco use: Secondary | ICD-10-CM | POA: Insufficient documentation

## 2014-01-03 DIAGNOSIS — G629 Polyneuropathy, unspecified: Secondary | ICD-10-CM | POA: Insufficient documentation

## 2014-01-03 DIAGNOSIS — Z79899 Other long term (current) drug therapy: Secondary | ICD-10-CM | POA: Insufficient documentation

## 2014-01-03 DIAGNOSIS — G8929 Other chronic pain: Secondary | ICD-10-CM | POA: Insufficient documentation

## 2014-01-03 DIAGNOSIS — R200 Anesthesia of skin: Secondary | ICD-10-CM | POA: Insufficient documentation

## 2014-01-03 DIAGNOSIS — M542 Cervicalgia: Secondary | ICD-10-CM | POA: Insufficient documentation

## 2014-01-03 DIAGNOSIS — M549 Dorsalgia, unspecified: Secondary | ICD-10-CM | POA: Insufficient documentation

## 2014-01-03 MED ORDER — PREDNISONE 20 MG PO TABS: 40.0000 mg | ORAL_TABLET | Freq: Every day | ORAL | Status: DC

## 2014-01-03 MED ORDER — PREDNISONE 20 MG PO TABS: 60.0000 mg | ORAL_TABLET | Freq: Every day | ORAL | Status: DC

## 2014-01-03 MED ORDER — OXYCODONE-ACETAMINOPHEN 5-325 MG PO TABS
2.0000 | ORAL_TABLET | Freq: Once | ORAL | Status: AC
  Administered 2014-01-03: 2 via ORAL
  Filled 2014-01-03: qty 2

## 2014-01-03 MED ORDER — OXYCODONE HCL 5 MG PO TABS: 5.0000 mg | ORAL_TABLET | Freq: Four times a day (QID) | ORAL | Status: DC | PRN

## 2014-01-03 MED ORDER — ONDANSETRON 4 MG PO TBDP
8.0000 mg | ORAL_TABLET | Freq: Once | ORAL | Status: AC
  Administered 2014-01-03: 8 mg via ORAL
  Filled 2014-01-03: qty 2

## 2014-01-03 NOTE — ED Provider Notes (Signed)
CSN: [id number]     Arrival date & time 01/03/14  1226 History  This chart was scribed for non-physician practitioner Junious SilkHannah Kelsie Zaborowski, working with Suzi RootsKevin E Steinl, MD by Carl Bestelina Holson, ED Scribe. This patient was seen in room TR11C/TR11C and the patient's care was started at 3:18 PM.    Chief Complaint  Patient presents with  . Back Pain  . Arm Pain   Patient is a 33 y.o. male presenting with back pain and arm pain. The history is provided by the patient. No language interpreter was used.  Back Pain Associated symptoms: numbness   Associated symptoms: no fever   Arm Pain   HPI Comments: Cody ReeLoy Vanmaanen Jr. is a 33 y.o. male with a history of chronic pain who presents to the Emergency Department complaining of constant, worsening left upper arm pain with associated numbness in his fingertips bilaterally that started a month ago.  He has had an MRI done of his neck in the past which revealed a pinched nerve in his neck.  Turning his head to the left alleviates the pain.  He is also complaining of bilateral hip pain that started a month ago.  He states that his pain is different than his chronic pain and starts immediately after he finishes working and persists throughout the day.  He was prescribed 10mg  Oxycodone by Dr. Clelia CroftShaw.  He was also taking Flexeril which provided mild relief to his symptoms.  He takes Prednisone intermittently which relieved his pain the best.  He has not had a dose of Prednisone in a long time.  He was referred to a neurosurgeon by Dr[facility]Eva [facility] but states that he could not afford the surgery the neurosurgeon recommended.  He was also referred to a pain management clinic however he could not afford to be seen there.  He works Youth workergraveyard shift unloading trucks.  He denies fever and bowel or bladder incontinence as associated symptoms.  He denies any history of drug use.   Past Medical History  Diagnosis Date  . Chronic pain    History reviewed. No  pertinent past surgical history. Family History  Problem Relation Age of Onset  . Arthritis Mother     cervical DDD req anterior fusion  . Arthritis Father    History  Substance Use Topics  . Smoking status: Current Every Day Smoker -- 1.00 packs/day    Types: Cigarettes  . Smokeless tobacco: Not on file  . Alcohol Use: No    Review of Systems  Constitutional: Negative for fever.  Musculoskeletal: Positive for arthralgias.  Neurological: Positive for numbness.  All other systems reviewed and are negative.   Allergies  Review of patient's allergies indicates no known allergies.  Home Medications   Prior to Admission medications   Medication Sig Start Date End Date Taking? Authorizing Provider  diazepam (VALIUM) 5 MG tablet Take 1 tablet (5 mg total) by mouth every 12 (twelve) hours as needed for anxiety. 11/15/13   Graylon GoodZachary H Baker, PA-C  DULoxetine (CYMBALTA) 60 MG capsule Take 1 capsule (60 mg total) by mouth daily. 07/12/13   Pearline CablesJessica C Copland, MD  Multiple Vitamin (MULTIVITAMIN) tablet Take 1 tablet by mouth daily.    Historical Provider, MD  oxyCODONE (OXY IR/ROXICODONE) 5 MG immediate release tablet Take 1 tablet (5 mg total) by mouth every 6 (six) hours as needed for moderate pain or severe pain. 11/15/13   Graylon GoodZachary H Baker, PA-C  Oxycodone HCl 10 MG TABS  Take 10 mg by mouth every 6 (six) hours as needed (pain). May fill on 09/14/2013 09/02/13   Heather M Marte, PA-C   BP 167/90  Pulse 86  Temp(Src) 98.5 F (36.9 C) (Oral)  Resp 18  SpO2 99% Physical Exam  Nursing note and vitals reviewed. Constitutional: He is oriented to person, place, and time. He appears well-developed and well-nourished. He appears distressed.  HENT:  Head: Normocephalic and atraumatic.  Right Ear: External ear normal.  Left Ear: External ear normal.  Nose: Nose normal.  Mouth/Throat: Uvula is midline, oropharynx is clear and moist and mucous membranes are normal.  Eyes: Conjunctivae and EOM are  normal.  Neck: Normal range of motion. Muscular tenderness present. No spinous process tenderness present. No tracheal deviation present.    Cardiovascular: Normal rate, regular rhythm, normal heart sounds, intact distal pulses and normal pulses.   No murmur heard. Pulses:      Radial pulses are 2+ on the right side, and 2+ on the left side.  Pulmonary/Chest: Effort normal and breath sounds normal. No stridor. No respiratory distress. He has no wheezes. He has no rales.  Abdominal: Soft. He exhibits no distension. There is no tenderness.  Musculoskeletal: Normal range of motion.  Moves all extremities without guarding or ataxia.   Neurological: He is alert and oriented to person, place, and time. He has normal strength. Coordination and gait normal. GCS eye subscore is 4. GCS verbal subscore is 5. GCS motor subscore is 6.  Patient continually shaking arms out to feel better Grip strength 5/5 bilaterally Normal gait  Skin: Skin is warm and dry. He is not diaphoretic.  Psychiatric: He has a normal mood and affect. His behavior is normal.    ED Course  Procedures (including critical care time)  DIAGNOSTIC STUDIES: Oxygen Saturation is 96% on room air, adequate by my interpretation.    COORDINATION OF CARE: 3:28 PM- Will discharge the patient with a referral to a neurosurgeon, muscle relaxer, and pain medication.  The patient agreed to the treatment plan.   Labs Review Labs Reviewed - No data to display  Imaging Review No results found.   EKG Interpretation None      MDM   Final diagnoses:  Neck pain  Neuropathy   Patient presents to ED for evaluation of neck pain and arm pain. This is his chronic pain. Patient with no new injuries. No neuro deficit. No red flags. Will give neurosurgery referral that patient is requesting. Discussed reasons to return to ED immediately. Vital signs stable for discharge. Patient / Family / Caregiver informed of clinical course, understand  medical decision-making process, and agree with plan.   I personally performed the services described in this documentation, which was scribed in my presence. The recorded information has been reviewed and is accurate.    Mora BellmanHannah S Kalei Mckillop, PA-C 01/08/14 [phone]

## 2014-01-03 NOTE — Discharge Instructions (Signed)
Pinched Nerve The term pinched nerve describes one type of damage or injury to a nerve or set of nerves. Pinched nerves can sometimes lead to other conditions. These include peripheral neuropathy, carpal tunnel syndrome, and tennis elbow. The extent of such injuries may vary from minor, temporary damage to a more permanent condition. Early diagnosis is important to prevent further damage or complications. Pinched nerve is a common cause of on-the-job injury. CAUSES  The injury may result from:  Compression.  Constriction.  Stretching. SYMPTOMS  Symptoms include:  Numbness.  "Pins and needles" or burning sensations.  Pain radiating outward from the injured area.  One of the most common examples of a single compressed nerve is the feeling of having a foot or hand "fall asleep." TREATMENT  The most often recommended treatment for pinched nerve is rest for the affected area. Corticosteroids help alleviate pain. In some cases, surgery is recommended. Physical therapy may be recommended. Splints or collars may be used. With treatment, most people recover from pinched nerve. In some cases, the damage is irreversible. Document Released: 02/26/2002 Document Revised: 05/31/2011 Document Reviewed: 02/14/2008 [facility] Patient Information 2015 [place], [place]. This information is not intended to replace advice given to you by your health care provider. Make sure you discuss any questions you have with your health care provider.   Emergency Department Resource Guide 1) Find a Doctor and Pay Out of Pocket Although you won't have to find out who is covered by your insurance plan, it is a good idea to ask around and get recommendations. You will then need to call the office and see if the doctor you have chosen will accept you as a new patient and what types of options they offer for patients who are self-pay. Some doctors offer discounts or will set up payment plans for their patients who do not have  insurance, but you will need to ask so you aren't surprised when you get to your appointment.  2) Contact Your Local Health Department Not all health departments have doctors that can see patients for sick visits, but many do, so it is worth a call to see if yours does. If you don't know where your local health department is, you can check in your phone book. The CDC also has a tool to help you locate your state's health department, and many state websites also have listings of all of their local health departments.  3) Find a Walk-in Clinic If your illness is not likely to be very severe or complicated, you may want to try a walk in clinic. These are popping up all over the country in pharmacies, drugstores, and shopping centers. They're usually staffed by nurse practitioners or physician assistants that have been trained to treat common illnesses and complaints. They're usually fairly quick and inexpensive. However, if you have serious medical issues or chronic medical problems, these are probably not your best option.  No Primary Care Doctor: - Call Health Connect at  [phone] - they can help you locate a primary care doctor that  accepts your insurance, provides certain services, etc. - Physician Referral Service- [phone]  Chronic Pain Problems: Organization         Address  Phone   Notes  [facility]  [phone] Patients need to be referred by their primary care doctor.   Medication Assistance: Organization         Address  Phone   Notes  [facility] Medication Assistance Program [phone] [place], [place] [address] [phone]) [phone] --Must be a resident of [place] [place] -- Must have NO insurance coverage whatsoever (no Medicaid/ Medicare, etc.) -- The pt. MUST have a primary care doctor that directs their care regularly and follows them in the community   MedAssist  [phone]   [employer]  [phone]) [phone]    Agencies that provide  inexpensive medical care: Organization         Address  Phone   Notes  [facility]  [phone]   [facility]    [phone]   [facility] [address] [place], [place] [address] [phone]) [phone]   [facility] [address], [place] [phone]   [facility]anned [facility]    [phone]) [phone]   [facility]    [phone]) [phone]   [facility]  [address], [place] Phone:  [phone](336) [phone], Fax:  [phone] Hours of Operation:  9 am - 6 pm, M-F.  Also accepts Medicaid/Medicare and self-pay.  [facility]  [address], [place] Phone: [phone](336) [phone], Fax: [phone]) [phone]. Hours of Operation:  8:30 am - 5:30 pm, M-F.  Also accepts Medicaid and self-pay.  [facility] [place] [address], [place] Phone: [phone]) [phone]   [facility] [address], [place], [place] [phone], Ext. [phone] Mondays & Thursdays: 7-9 AM.  First 15 patients are seen on a first come, first serve basis.    Medicaid-accepting [facility] Providers:  Organization         Address  Phone   Notes  [facility] [address], [place] [phone] Also accepts self-pay patients.  [facility] [address], [place]  [phone]) [phone]   [facility] [address], [place] [phone]   [facility] [address] [place] [address], [place] [phone]) [phone]   Renaye RakersVeita Bland [address], [place]   [phone] Only accepts [place] Access [place] patients after they have their name applied to their card.   Self-Pay (no insurance) in [facility] [place]:  Organization         Address  Phone   Notes  Sickle Cell Patients, [facility] [address], [place] [phone]   [facility] [address], [place] [phone]   [facility]  [address], [place] [phone]   [facility]/Dr. Osei-Bonsu  [address] [place] [address], [place] or [phone], [place] [phone] Phone number for both [place] and [place] locations is the same.  [facility] [address], [place] [phone]) [phone]   [facility] [address] [place] [address], [place] or [address] [phone]) [phone]) [phone]   [facility] [address], [place] [phone], phone; [phone](336) [phone], fax Sees patients 1st and 3rd Saturday of every month.  Must not qualify for public or private insurance (i.e. Medicaid, Medicare, [place] Health Choice, Veterans' Benefits)  Household income should be no more than 200% of the poverty level The clinic cannot treat you if you are pregnant or think you are pregnant  Sexually transmitted diseases are not treated at the clinic.  Dental Care: Organization         Address  Phone  Notes  [facility] [address] [place], [place] [phone] Accepts children up to age 74 who are enrolled in [place] or [place] Health Choice; pregnant women with a Medicaid card; and children who have applied for Medicaid or [place] Health Choice, but were declined, whose parents can pay a reduced fee at time of service.  [facility]  [address], [place] [phone] Accepts children up to age 20 who are enrolled in [place] or [place] Health Choice; pregnant women with a Medicaid card; and children who have applied for Medicaid or [place] Health Choice, but were declined, whose parents can pay a reduced fee at time of service.  [facility]  [address] [place], [place] [phone] Patients are seen by appointment only. Walk-ins are  not accepted. [facility] will see patients 59 years of age and older. Monday - Tuesday (8am-5pm) Most Wednesdays (8:30-5pm) $30 per visit, cash only  [facility] Access PRO[facility]  [address], [facility] [phone] Patients are seen by appointment only. Walk-ins are not accepted. [facility] will see patients 38 years of age and older. One Wednesday Evening (Monthly: Volunteer Based).  $30 per visit, cash only  [employer]  [phone] for adults; Children under age 6, call Graduate Pediatric Dentistry at [phone]. Children aged 23-14, please call [phone] to request a pediatric application.  Dental services are provided in all areas of dental care including fillings, crowns and bridges, complete and partial dentures, implants, gum treatment, root canals, and extractions. Preventive care is also provided. Treatment is provided to both adults and children. Patients are selected via a lottery and there is often a waiting list.   [facility] [address] [place]  [phone] [email or web address]   [facility] [address] [place], [place] [phone], Ext. [phone] Second and Fourth Thursday of each month, opens at 6:30 AM; Clinic ends at 9 AM.  Patients are seen on a first-come first-served basis, and a limited number are seen during each clinic.   [facility]  [address] [place], [place] [phone]   Eligibility Requirements You must have lived in [place], [place], or [place] counties for at least the last three months.   You cannot be eligible for state or federal sponsored [place], including [employer], [place], or [employer].   You generally cannot be eligible for healthcare insurance through your employer.    How to apply: Eligibility screenings are held every Tuesday and Wednesday afternoon from 1:00 pm until 4:00 pm. You do not need an appointment for  the interview!  [facility] [address] [place], [place] [phone]   [facility] Health Department  [phone]   [facility]  [phone]   [facility] Health Department  [phone]    Behavioral Health Resources in the Community: Intensive Outpatient Programs Organization         Address  Phone  Notes  [facility] Services [address], [place], [place] [phone]   [facility] [address] [place], [place] [phone]   [facility]: [facility] Drug [facility] [address] [place], [place]  [phone]   [facility] [address],  [place], [place] [phone] or [phone]   Substance Abuse Resources Organization         Address  Phone  Notes  [facility]  [phone]   [facility]  [phone]   [facility]  [phone]   [facility]  [phone]   Residential & Outpatient Substance Abuse Program  [phone]   Psychological Services Organization         Address  Phone  Notes  [facility]  [phone]   [facility]  [phone]- [phone]   [facility] [address], [place] [phone] or [phone]    Mobile Crisis Teams Organization         Address  Phone  Notes  [facility], Mobile Crisis Care Unit  [phone]   [facility]  [address] [place], [place] [phone]   Doristine LocksSharon DeEsch [address] [place] [phone]    Self-Help/Support Groups Organization         Address  Phone             Notes  [facility] [place] - variety of support groups  336- [phone] Call for more information  Narcotics Anonymous (NA), [facility] [address], [place]  2 meetings at this location   Statisticianesidential Treatment Programs Organization         Address  Phone  Notes  [facility]  [address],    [place]  [phone]   [facility]  [address], [place] [phone], [place], [place] [phone]   [facility] [address], [place] [phone] Admissions: 8am-3pm M-F  [facility] [address]    [place], [place] [phone]   [facility] [address], [place], [place] [phone]   [facility] [address]  [place], [place] [phone]   [facility] - Intensive Outpatient [address], [place], [place] [phone]   [facility] ([facility] [address]  [place], [place] [phone] or [phone]   [facility] ([facility]) [address] [place], [place] [phone] Accepts Medicaid  [facility] [address]  [place] [phone] Substance Abuse/Addiction Treatment   [facility] Behavioral Health Resources Organization         Address  Phone  Notes  [facility]  [phone]) [phone]   Angie FavaJulie Brannon, PhD [address], [address] [place], [place]   [phone] or [phone]) [phone]   [facility]   [address] [place], [place] [phone]) [phone]   [facility] [address], [place], [place] [phone]) [phone] Insurance/Medicaid/sponsorship through [facility]  [facility] [address]                                    [place], [place] [phone]) [phone] Therapy/tele-psych/case  [facility] [address].   [place], [place] [phone](336) [phone]    Dr. Lolly MustacheArfeen  [phone]) [phone]   [facility]  [employer] [facility] 1) [address], [place] 2) [address], [place] 3)  [address], [place] [phone]) [phone]) [phone]  [phone](336) [phone]   [facility] Child Abuse Hotline [phone](336) [phone] or [phone](336) [phone] (After Hours)

## 2014-01-03 NOTE — ED Notes (Addendum)
Patient states that he has been referred to pain management clinic but he doesn't have the money to go there.  Patient states he needs to get cymbalta, diazepam, oxycodone, and prednisone for pain.   Patient states "ED gives me all these till I can get to pain management".   Patient states that he can no longer see PCP because his wife got into fight with a nurse.

## 2014-01-08 NOTE — ED Provider Notes (Signed)
Medical screening examination/treatment/procedure(s) were performed by non-physician practitioner and as supervising physician I was immediately available for consultation/collaboration.     Suzi RootsKevin E Reannah Totten, MD 01/08/14 [phone]

## 2014-01-09 ENCOUNTER — Encounter (HOSPITAL_COMMUNITY): Payer: Self-pay | Admitting: Emergency Medicine

## 2014-01-09 ENCOUNTER — Emergency Department (HOSPITAL_COMMUNITY)
Admission: EM | Admit: 2014-01-09 | Discharge: 2014-01-09 | Discharge status: Against Medical Advice | Payer: Self-pay | Attending: Emergency Medicine | Admitting: Emergency Medicine

## 2014-01-09 DIAGNOSIS — G8929 Other chronic pain: Secondary | ICD-10-CM | POA: Insufficient documentation

## 2014-01-09 DIAGNOSIS — Z720 Tobacco use: Secondary | ICD-10-CM | POA: Insufficient documentation

## 2014-01-09 DIAGNOSIS — Z760 Encounter for issue of repeat prescription: Secondary | ICD-10-CM | POA: Insufficient documentation

## 2014-01-09 NOTE — ED Notes (Signed)
Pt was seen here for Rx refills on 10/15. Returns today because he doesn't have enough pain meds to get him through to his appointment with Dr.Stern in 10 days. C/o neck and bilateral arm pain.

## 2014-01-09 NOTE — ED Provider Notes (Signed)
Patient presents with med refill request.  11:12 AM Attempted to evaluate. patient was not in room. Patient returned to room for less than 5 minutes and left ED without notifying staff he was leaving the ED. Pt was holding paperwork from previous ED visit in one hand. Patient was ambulatory in no acute distress accompanied by another male.  11:32 AM-Pt left ED.   Mellody DrownLauren Liylah Najarro, PA-C 01/09/14 1216

## 2014-01-09 NOTE — ED Notes (Signed)
Pt has apparently left. Did not say he was leaving.

## 2014-01-10 NOTE — ED Provider Notes (Signed)
Medical screening examination/treatment/procedure(s) were performed by non-physician practitioner and as supervising physician I was immediately available for consultation/collaboration.   EKG Interpretation None       Dalexa Gentz, MD 01/10/14 0812 

## 2014-01-11 ENCOUNTER — Ambulatory Visit: Payer: Self-pay | Admitting: Family Medicine

## 2014-01-19 ENCOUNTER — Emergency Department (INDEPENDENT_AMBULATORY_CARE_PROVIDER_SITE_OTHER)
Admission: EM | Admit: 2014-01-19 | Discharge: 2014-01-19 | Disposition: A | Discharge status: Home / Self Care | Payer: Self-pay | Source: Home / Self Care | Attending: Emergency Medicine | Admitting: Emergency Medicine

## 2014-01-19 ENCOUNTER — Encounter (HOSPITAL_COMMUNITY): Payer: Self-pay | Admitting: Emergency Medicine

## 2014-01-19 DIAGNOSIS — M5412 Radiculopathy, cervical region: Secondary | ICD-10-CM

## 2014-01-19 MED ORDER — GABAPENTIN 300 MG PO CAPS: ORAL_CAPSULE | ORAL | Status: DC

## 2014-01-19 MED ORDER — MELOXICAM 15 MG PO TABS: 15.0000 mg | ORAL_TABLET | Freq: Every day | ORAL | Status: DC

## 2014-01-19 MED ORDER — CYCLOBENZAPRINE HCL 5 MG PO TABS: 5.0000 mg | ORAL_TABLET | Freq: Three times a day (TID) | ORAL | Status: DC | PRN

## 2014-01-19 NOTE — Discharge Instructions (Signed)
TREATMENT  °Treatment initially involves the use of ice and medication to help reduce pain and inflammation. It is also important to perform strengthening and stretching exercises and modify activities that worsen symptoms so the injury does not get worse. These exercises may be performed at home or with a therapist. For patients who experience severe symptoms, a soft padded collar may be recommended to be worn around the neck.  °Improving your posture may help reduce symptoms. Posture improvement includes pulling your chin and abdomen in while sitting or standing. If you are sitting, sit in a firm chair with your buttocks against the back of the chair. While sleeping, try replacing your pillow with a small towel rolled to 2 inches in diameter, or use a cervical pillow. Poor sleeping positions delay healing.  ° °MEDICATION  °· If pain medication is necessary, nonsteroidal anti-inflammatory medications, such as aspirin and ibuprofen, or other minor pain relievers, such as acetaminophen, are often recommended. °· Do not take pain medication for 7 days before surgery. °· Prescription pain relievers may be given if deemed necessary by your caregiver. Use only as directed and only as much as you need. ° °HEAT AND COLD:  °· Cold treatment (icing) relieves pain and reduces inflammation. Cold treatment should be applied for 10 to 15 minutes every 2 to 3 hours for inflammation and pain and immediately after any activity that aggravates your symptoms. Use ice packs or an ice massage. °· Heat treatment may be used prior to performing the stretching and strengthening activities prescribed by your caregiver, physical therapist, or athletic trainer. Use a heat pack or a warm soak. ° °SEEK MEDICAL CARE IF:  °· Symptoms get worse or do not improve in 2 weeks despite treatment. °· New, unexplained symptoms develop (drugs used in treatment may produce side effects). ° °EXERCISES °RANGE OF MOTION (ROM) AND STRETCHING EXERCISES -  Cervical Strain and Sprain °These exercises may help you when beginning to rehabilitate your injury. In order to successfully resolve your symptoms, you must improve your posture. These exercises are designed to help reduce the forward-head and rounded-shoulder posture which contributes to this condition. Your symptoms may resolve with or without further involvement from your physician, physical therapist or athletic trainer. While completing these exercises, remember:  °· Restoring tissue flexibility helps normal motion to return to the joints. This allows healthier, less painful movement and activity. °· An effective stretch should be held for at least 20 seconds, although you may need to begin with shorter hold times for comfort. °· A stretch should never be painful. You should only feel a gentle lengthening or release in the stretched tissue. ° °STRETCH- Axial Extensors °· Lie on your back on the floor. You may bend your knees for comfort. Place a rolled up hand towel or dish towel, about 2 inches in diameter, under the part of your head that makes contact with the floor. °· Gently tuck your chin, as if trying to make a "double chin," until you feel a gentle stretch at the base of your head. °· Hold _____10_____ seconds. °Repeat _____10_____ times. Complete this exercise _____2_____ times per day.  ° °STRETECH - Axial Extension  °· Stand or sit on a firm surface. Assume a good posture: chest up, shoulders drawn back, abdominal muscles slightly tense, knees unlocked (if standing) and feet hip width apart. °· Slowly retract your chin so your head slides back and your chin slightly lowers.Continue to look straight ahead. °· You should feel a gentle stretch   in the back of your head. Be certain not to feel an aggressive stretch since this can cause headaches later. °· Hold for ____10______ seconds. °Repeat _____10_____ times. Complete this exercise ____2______ times per day. ° °STRETCH  Cervical Side Bend  °· Stand  or sit on a firm surface. Assume a good posture: chest up, shoulders drawn back, abdominal muscles slightly tense, knees unlocked (if standing) and feet hip width apart. °· Without letting your nose or shoulders move, slowly tip your right / left ear to your shoulder until your feel a gentle stretch in the muscles on the opposite side of your neck. °· Hold _____10_____ seconds. °Repeat _____10_____ times. Complete this exercise _____2_____ times per day. ° °STRETCH  Cervical Rotators  °· Stand or sit on a firm surface. Assume a good posture: chest up, shoulders drawn back, abdominal muscles slightly tense, knees unlocked (if standing) and feet hip width apart. °· Keeping your eyes level with the ground, slowly turn your head until you feel a gentle stretch along the back and opposite side of your neck. °· Hold _____10_____ seconds. °Repeat ____10______ times. Complete this exercise ____2______ times per day. ° °RANGE OF MOTION - Neck Circles  °· Stand or sit on a firm surface. Assume a good posture: chest up, shoulders drawn back, abdominal muscles slightly tense, knees unlocked (if standing) and feet hip width apart. °· Gently roll your head down and around from the back of one shoulder to the back of the other. The motion should never be forced or painful. °· Repeat the motion 10-20 times, or until you feel the neck muscles relax and loosen. °Repeat ____10______ times. Complete the exercise _____2_____ times per day. ° °STRENGTHENING EXERCISES - Cervical Strain and Sprain °These exercises may help you when beginning to rehabilitate your injury. They may resolve your symptoms with or without further involvement from your physician, physical therapist or athletic trainer. While completing these exercises, remember:  °· Muscles can gain both the endurance and the strength needed for everyday activities through controlled exercises. °· Complete these exercises as instructed by your physician, physical therapist or  athletic trainer. Progress the resistance and repetitions only as guided. °· You may experience muscle soreness or fatigue, but the pain or discomfort you are trying to eliminate should never worsen during these exercises. If this pain does worsen, stop and make certain you are following the directions exactly. If the pain is still present after adjustments, discontinue the exercise until you can discuss the trouble with your clinician. ° °STRENGTH Cervical Flexors, Isometric °· Face a wall, standing about 6 inches away. Place a small pillow, a ball about 6-8 inches in diameter, or a folded towel between your forehead and the wall. °· Slightly tuck your chin and gently push your forehead into the soft object. Push only with mild to moderate intensity, building up tension gradually. Keep your jaw and forehead relaxed. °· Hold 10 to 20 seconds. Keep your breathing relaxed. °· Release the tension slowly. Relax your neck muscles completely before you start the next repetition. °Repeat _____10_____ times. Complete this exercise _____2_____ times per day. ° °STRENGTH- Cervical Lateral Flexors, Isometric  °· Stand about 6 inches away from a wall. Place a small pillow, a ball about 6-8 inches in diameter, or a folded towel between the side of your head and the wall. °· Slightly tuck your chin and gently tilt your head into the soft object. Push only with mild to moderate intensity, building up tension gradually. Keep   your jaw and forehead relaxed. °· Hold 10 to 20 seconds. Keep your breathing relaxed. °· Release the tension slowly. Relax your neck muscles completely before you start the next repetition. °Repeat _____10_____ times. Complete this exercise ____2______ times per day. ° °STRENGTH  Cervical Extensors, Isometric  °· Stand about 6 inches away from a wall. Place a small pillow, a ball about 6-8 inches in diameter, or a folded towel between the back of your head and the wall. °· Slightly tuck your chin and gently  tilt your head back into the soft object. Push only with mild to moderate intensity, building up tension gradually. Keep your jaw and forehead relaxed. °· Hold 10 to 20 seconds. Keep your breathing relaxed. °· Release the tension slowly. Relax your neck muscles completely before you start the next repetition. °Repeat _____10_____ times. Complete this exercise _____2_____ times per day. ° °POSTURE AND BODY MECHANICS CONSIDERATIONS - Cervical Strain and Sprain °Keeping correct posture when sitting, standing or completing your activities will reduce the stress put on different body tissues, allowing injured tissues a chance to heal and limiting painful experiences. The following are general guidelines for improved posture. Your physician or physical therapist will provide you with any instructions specific to your needs. While reading these guidelines, remember: °· The exercises prescribed by your provider will help you have the flexibility and strength to maintain correct postures. °· The correct posture provides the optimal environment for your joints to work. All of your joints have less wear and tear when properly supported by a spine with good posture. This means you will experience a healthier, less painful body. °· Correct posture must be practiced with all of your activities, especially prolonged sitting and standing. Correct posture is as important when doing repetitive low-stress activities (typing) as it is when doing a single heavy-load activity (lifting). °PROLONGED STANDING WHILE SLIGHTLY LEANING FORWARD °When completing a task that requires you to lean forward while standing in one place for a long time, place either foot up on a stationary 2-4 inch high object to help maintain the best posture. When both feet are on the ground, the low back tends to lose its slight inward curve. If this curve flattens (or becomes too large), then the back and your other joints will experience too much stress, fatigue  more quickly and can cause pain.  °RESTING POSITIONS °Consider which positions are most painful for you when choosing a resting position. If you have pain with flexion-based activities (sitting, bending, stooping, squatting), choose a position that allows you to rest in a less flexed posture. You would want to avoid curling into a fetal position on your side. If your pain worsens with extension-based activities (prolonged standing, working overhead), avoid resting in an extended position such as sleeping on your stomach. Most people will find more comfort when they rest with their spine in a more neutral position, neither too rounded nor too arched. Lying on a non-sagging bed on your side with a pillow between your knees, or on your back with a pillow under your knees will often provide some relief. Keep in mind, being in any one position for a prolonged period of time, no matter how correct your posture, can still lead to stiffness. °WALKING °Walk with an upright posture. Your ears, shoulders and hips should all line-up. °OFFICE WORK °When working at a desk, create an environment that supports good, upright posture. Without extra support, muscles fatigue and lead to excessive strain on joints and other tissues. °  CHAIR: °· A chair should be able to slide under your desk when your back makes contact with the back of the chair. This allows you to work closely. °· The chair's height should allow your eyes to be level with the upper part of your monitor and your hands to be slightly lower than your elbows. °· Body position: °· Your feet should make contact with the floor. If this is not possible, use a foot rest. °· Keep your ears over your shoulders. This will reduce stress on your neck and low back. °Document Released: 03/08/2005 Document Revised: 05/31/2011 Document Reviewed: 06/20/2008 °[employer]® Patient Information ©2013 [employer]. ° ° °

## 2014-01-19 NOTE — ED Provider Notes (Signed)
Chief Complaint   Medication Refill   History of Present Illness   Cody ReeLoy Haaland Jr. is a 33 year old male who presents today for a refill on pain medication and Valium. He has had a two-year history of pain in his neck radiating down both arms and the hands with numbness of the hands and weakness of the arms. The pain is rated 10 over 10 in intensity. His neck has a full range of motion with minimal pain. The patient had an MRI done by [facility] Cle[facility]Shaw [facility]. 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 He was referred to Dr. Toy BakerJody Sterns for neurosurgical consult but has not seen him yet. The patient states he has been taking Percocet and Valium for this. He is requesting refills on both of these medications.  Review of Systems   Other than noted above, the patient denies any of the following symptoms: Constitutional:  No fever or chills. Neck:  No swelling, or adenopathy.   Cardiac:  No chest pain, tightness, or pressure. Respiratory:  No cough or dyspnea. M-S:  No joint pain, muscle pain, or back problems. Neuro:  No headache, muscle weakness, or paresthesias.  PMFSH   Past medical history, family history, social history, meds, and allergies were reviewed.    Physical Examination    Vital signs:  BP 137/71  Pulse 98  Temp(Src) 98.3 F (36.8 C) (Oral)  Resp 18  SpO2 97% General:  Alert, oriented and in no distress. ENT:  Pharynx clear, no oral lesions. Neck:  There was minimal pain to palpation in the neck. The neck had a full range of motion with minimal pain.  Spurling's test was negative. Lungs:  No respiratory distress.  Breath sounds clear and equal bilaterally.  No wheezes, rales or rhonchi. Heart:  Regular rhythm.  No gallops, murmers, or rubs. Ext:  No upper extremity edema, pulses full.  Full ROM of joints with no joint or muscle pain to palpation. Neuro:  Alert and oriented times 3.  No focal muscle weakness.  DTRs symmetric.  Sensation  intact to light touch. Skin: Clear, warm and dry.  No rash.  Good capillary refill.  Assessment   The encounter diagnosis was Cervical radiculopathy.  I explained to the patient that we did not treat chronic pain with opioid pain medications or prescribe Valium for anxiety here. He will need to see a primary care physician or pain management for this. I urged him to follow-up with Dr. Lezlie LyeStearns.  Plan    1.  Meds:  The following meds were prescribed:   Discharge Medication List as of 01/19/2014  3:29 PM    START taking these medications   Details  cyclobenzaprine (FLEXERIL) 5 MG tablet Take 1 tablet (5 mg total) by mouth 3 (three) times daily as needed for muscle spasms., Starting 01/19/2014, Until Discontinued, Normal    gabapentin (NEURONTIN) 300 MG capsule 1 daily for 3 days, 1 BID for 3 days, 1 TID thereafter, Normal    meloxicam (MOBIC) 15 MG tablet Take 1 tablet (15 mg total) by mouth daily., Starting 01/19/2014, Until Discontinued, Normal        2.  Patient Education/Counseling:  The patient was given appropriate handouts, self care instructions, and instructed in pain control.  Given exercises to do twice daily followed by moist heat.    3.  Follow up:  The patient was told to follow up here if no better in 3 to 4 days, or sooner  if becoming worse in any way, and given some red flag symptoms such as worsening pain or new neurological symptoms which would prompt immediate return.       Reuben Likesavid C Aldeen Riga, MD 01/19/14 [phone]

## 2014-01-19 NOTE — ED Notes (Signed)
Pt states that he needs two of his medication refilled  oxycodone and valium pt states that he does not have a PCP.

## 2014-03-04 ENCOUNTER — Encounter (HOSPITAL_COMMUNITY): Payer: Self-pay | Admitting: Physical Medicine and Rehabilitation

## 2014-03-04 ENCOUNTER — Emergency Department (HOSPITAL_COMMUNITY)
Admission: EM | Admit: 2014-03-04 | Discharge: 2014-03-04 | Discharge status: Against Medical Advice | Payer: Self-pay | Attending: Emergency Medicine | Admitting: Emergency Medicine

## 2014-03-04 DIAGNOSIS — Y990 Civilian activity done for income or pay: Secondary | ICD-10-CM | POA: Insufficient documentation

## 2014-03-04 DIAGNOSIS — S4991XA Unspecified injury of right shoulder and upper arm, initial encounter: Secondary | ICD-10-CM | POA: Insufficient documentation

## 2014-03-04 DIAGNOSIS — G8929 Other chronic pain: Secondary | ICD-10-CM | POA: Insufficient documentation

## 2014-03-04 DIAGNOSIS — Z720 Tobacco use: Secondary | ICD-10-CM | POA: Insufficient documentation

## 2014-03-04 DIAGNOSIS — W231XXA Caught, crushed, jammed, or pinched between stationary objects, initial encounter: Secondary | ICD-10-CM | POA: Insufficient documentation

## 2014-03-04 DIAGNOSIS — Y9389 Activity, other specified: Secondary | ICD-10-CM | POA: Insufficient documentation

## 2014-03-04 DIAGNOSIS — Y9289 Other specified places as the place of occurrence of the external cause: Secondary | ICD-10-CM | POA: Insufficient documentation

## 2014-03-04 LAB — CBC WITH DIFFERENTIAL/PLATELET
BASOS PCT: 0 % (ref 0–1)
Basophils Absolute: 0 10*3/uL (ref 0.0–0.1)
EOS PCT: 0 % (ref 0–5)
Eosinophils Absolute: 0 10*3/uL (ref 0.0–0.7)
HCT: 43.6 % (ref 39.0–52.0)
Hemoglobin: 14.9 g/dL (ref 13.0–17.0)
LYMPHS ABS: 1.8 10*3/uL (ref 0.7–4.0)
Lymphocytes Relative: 9 % — ABNORMAL LOW (ref 12–46)
MCH: 33.6 pg (ref 26.0–34.0)
MCHC: 34.2 g/dL (ref 30.0–36.0)
MCV: 98.4 fL (ref 78.0–100.0)
MONOS PCT: 6 % (ref 3–12)
Monocytes Absolute: 1.3 10*3/uL — ABNORMAL HIGH (ref 0.1–1.0)
Neutro Abs: 17.1 10*3/uL — ABNORMAL HIGH (ref 1.7–7.7)
Neutrophils Relative %: 85 % — ABNORMAL HIGH (ref 43–77)
PLATELETS: 259 10*3/uL (ref 150–400)
RBC: 4.43 MIL/uL (ref 4.22–5.81)
RDW: 12.6 % (ref 11.5–15.5)
WBC: 20.2 10*3/uL — ABNORMAL HIGH (ref 4.0–10.5)

## 2014-03-04 LAB — COMPREHENSIVE METABOLIC PANEL
ALBUMIN: 3.5 g/dL (ref 3.5–5.2)
ALK PHOS: 101 U/L (ref 39–117)
ALT: 74 U/L — ABNORMAL HIGH (ref 0–53)
AST: 13 U/L (ref 0–37)
Anion gap: 14 (ref 5–15)
BUN: 13 mg/dL (ref 6–23)
CO2: 22 mEq/L (ref 19–32)
Calcium: 9.6 mg/dL (ref 8.4–10.5)
Chloride: 99 mEq/L (ref 96–112)
Creatinine, Ser: 0.83 mg/dL (ref 0.50–1.35)
GFR calc Af Amer: >90 mL/min (ref >90)
GFR calc non Af Amer: >90 mL/min (ref >90)
Glucose, Bld: 162 mg/dL — ABNORMAL HIGH (ref 70–99)
POTASSIUM: 4.4 meq/L (ref 3.7–5.3)
SODIUM: 135 meq/L — AB (ref 137–147)
TOTAL PROTEIN: 8.2 g/dL (ref 6.0–8.3)
Total Bilirubin: 0.9 mg/dL (ref 0.3–1.2)

## 2014-03-04 NOTE — ED Notes (Signed)
Pt presents to department for evaluation of R arm swelling and pain. States he got arm caught in machine at work several days ago, area directly above elbow noted to be red, warm and swollen. 10/10 pain upon arrival to ED. Pt is alert and oriented x4. NAD.

## 2014-03-04 NOTE — ED Notes (Signed)
Pt call again for room- no response.

## 2014-03-11 ENCOUNTER — Emergency Department (HOSPITAL_COMMUNITY): Payer: Self-pay | Admitting: Certified Registered Nurse Anesthetist

## 2014-03-11 ENCOUNTER — Inpatient Hospital Stay (HOSPITAL_COMMUNITY)
Admission: EM | Admit: 2014-03-11 | Discharge: 2014-03-14 | DRG: 603 | Disposition: A | Discharge status: Home / Self Care | Payer: Self-pay | Attending: General Surgery | Admitting: General Surgery

## 2014-03-11 ENCOUNTER — Encounter (HOSPITAL_COMMUNITY)
Admission: EM | Disposition: A | Discharge status: Home / Self Care | Payer: Self-pay | Source: Home / Self Care | Attending: General Surgery

## 2014-03-11 ENCOUNTER — Encounter (HOSPITAL_COMMUNITY): Payer: Self-pay | Admitting: Family Medicine

## 2014-03-11 ENCOUNTER — Emergency Department (HOSPITAL_COMMUNITY): Payer: Self-pay

## 2014-03-11 DIAGNOSIS — G8929 Other chronic pain: Secondary | ICD-10-CM | POA: Diagnosis present

## 2014-03-11 DIAGNOSIS — F17210 Nicotine dependence, cigarettes, uncomplicated: Secondary | ICD-10-CM | POA: Diagnosis present

## 2014-03-11 DIAGNOSIS — L03119 Cellulitis of unspecified part of limb: Secondary | ICD-10-CM | POA: Diagnosis present

## 2014-03-11 DIAGNOSIS — S4991XA Unspecified injury of right shoulder and upper arm, initial encounter: Secondary | ICD-10-CM

## 2014-03-11 DIAGNOSIS — L0291 Cutaneous abscess, unspecified: Secondary | ICD-10-CM

## 2014-03-11 DIAGNOSIS — B954 Other streptococcus as the cause of diseases classified elsewhere: Secondary | ICD-10-CM | POA: Diagnosis present

## 2014-03-11 DIAGNOSIS — F419 Anxiety disorder, unspecified: Secondary | ICD-10-CM | POA: Diagnosis present

## 2014-03-11 DIAGNOSIS — L02413 Cutaneous abscess of right upper limb: Principal | ICD-10-CM | POA: Diagnosis present

## 2014-03-11 HISTORY — PX: I&D EXTREMITY: SHX5045

## 2014-03-11 LAB — CBC WITH DIFFERENTIAL/PLATELET
BASOS PCT: 0 % (ref 0–1)
Basophils Absolute: 0 10*3/uL (ref 0.0–0.1)
EOS ABS: 0.1 10*3/uL (ref 0.0–0.7)
EOS PCT: 1 % (ref 0–5)
HEMATOCRIT: 40.9 % (ref 39.0–52.0)
HEMOGLOBIN: 13.9 g/dL (ref 13.0–17.0)
Lymphocytes Relative: 26 % (ref 12–46)
Lymphs Abs: 3 10*3/uL (ref 0.7–4.0)
MCH: 33.6 pg (ref 26.0–34.0)
MCHC: 34 g/dL (ref 30.0–36.0)
MCV: 98.8 fL (ref 78.0–100.0)
MONOS PCT: 11 % (ref 3–12)
Monocytes Absolute: 1.3 10*3/uL — ABNORMAL HIGH (ref 0.1–1.0)
NEUTROS ABS: 7.3 10*3/uL (ref 1.7–7.7)
NEUTROS PCT: 62 % (ref 43–77)
Platelets: 390 10*3/uL (ref 150–400)
RBC: 4.14 MIL/uL — ABNORMAL LOW (ref 4.22–5.81)
RDW: 13.1 % (ref 11.5–15.5)
WBC: 11.7 10*3/uL — ABNORMAL HIGH (ref 4.0–10.5)

## 2014-03-11 SURGERY — IRRIGATION AND DEBRIDEMENT EXTREMITY
Anesthesia: General | Site: Elbow | Laterality: Right

## 2014-03-11 MED ORDER — MIDAZOLAM HCL 5 MG/5ML IJ SOLN
INTRAMUSCULAR | Status: DC | PRN
  Administered 2014-03-11: 2 mg via INTRAVENOUS

## 2014-03-11 MED ORDER — HYDROMORPHONE HCL 1 MG/ML IJ SOLN
0.2500 mg | INTRAMUSCULAR | Status: DC | PRN
  Administered 2014-03-11 (×2): 0.5 mg via INTRAVENOUS

## 2014-03-11 MED ORDER — PHENYLEPHRINE HCL 10 MG/ML IJ SOLN
INTRAMUSCULAR | Status: DC | PRN
  Administered 2014-03-11 (×2): 80 ug via INTRAVENOUS

## 2014-03-11 MED ORDER — BUPIVACAINE HCL 0.5 % IJ SOLN
INTRAMUSCULAR | Status: DC | PRN
Start: 2014-03-11 — End: 2014-03-11
  Administered 2014-03-11: 10 mL

## 2014-03-11 MED ORDER — PROMETHAZINE HCL 25 MG/ML IJ SOLN
INTRAMUSCULAR | Status: AC
  Filled 2014-03-11: qty 1

## 2014-03-11 MED ORDER — LIDOCAINE HCL (CARDIAC) 20 MG/ML IV SOLN
INTRAVENOUS | Status: DC | PRN
  Administered 2014-03-11: 60 mg via INTRAVENOUS

## 2014-03-11 MED ORDER — FENTANYL CITRATE 0.05 MG/ML IJ SOLN
INTRAMUSCULAR | Status: AC
  Filled 2014-03-11: qty 5

## 2014-03-11 MED ORDER — PROMETHAZINE HCL 25 MG/ML IJ SOLN
6.2500 mg | INTRAMUSCULAR | Status: DC | PRN
  Administered 2014-03-11: 12.5 mg via INTRAVENOUS

## 2014-03-11 MED ORDER — CEFAZOLIN SODIUM-DEXTROSE 2-3 GM-% IV SOLR
2.0000 g | Freq: Once | INTRAVENOUS | Status: AC
  Administered 2014-03-11: 2 g via INTRAVENOUS
  Filled 2014-03-11: qty 50

## 2014-03-11 MED ORDER — OXYCODONE-ACETAMINOPHEN 5-325 MG PO TABS
1.0000 | ORAL_TABLET | ORAL | Status: DC | PRN
  Administered 2014-03-11 – 2014-03-14 (×11): 2 via ORAL
  Filled 2014-03-11 (×11): qty 2

## 2014-03-11 MED ORDER — FENTANYL CITRATE 0.05 MG/ML IJ SOLN
INTRAMUSCULAR | Status: DC | PRN
  Administered 2014-03-11 (×3): 50 ug via INTRAVENOUS
  Administered 2014-03-11: 100 ug via INTRAVENOUS

## 2014-03-11 MED ORDER — PROPOFOL 10 MG/ML IV BOLUS
INTRAVENOUS | Status: DC | PRN
  Administered 2014-03-11: 200 mg via INTRAVENOUS

## 2014-03-11 MED ORDER — OXYCODONE-ACETAMINOPHEN 5-325 MG PO TABS
1.0000 | ORAL_TABLET | Freq: Once | ORAL | Status: AC
  Administered 2014-03-11: 1 via ORAL
  Filled 2014-03-11: qty 1

## 2014-03-11 MED ORDER — SODIUM CHLORIDE 0.9 % IR SOLN
Status: DC | PRN
  Administered 2014-03-11: 3000 mL

## 2014-03-11 MED ORDER — HYDROMORPHONE HCL 1 MG/ML IJ SOLN
INTRAMUSCULAR | Status: AC
  Administered 2014-03-11 (×2): 0.5 mg
  Filled 2014-03-11: qty 1

## 2014-03-11 MED ORDER — KETOROLAC TROMETHAMINE 30 MG/ML IJ SOLN
30.0000 mg | Freq: Once | INTRAMUSCULAR | Status: AC
  Administered 2014-03-11: 30 mg via INTRAVENOUS
  Filled 2014-03-11: qty 1

## 2014-03-11 MED ORDER — VANCOMYCIN HCL IN DEXTROSE 1-5 GM/200ML-% IV SOLN
1000.0000 mg | Freq: Three times a day (TID) | INTRAVENOUS | Status: DC
  Administered 2014-03-11 – 2014-03-14 (×8): 1000 mg via INTRAVENOUS
  Filled 2014-03-11 (×13): qty 200

## 2014-03-11 MED ORDER — OXYCODONE HCL 5 MG/5ML PO SOLN: 5.0000 mg | Freq: Once | ORAL | Status: DC | PRN

## 2014-03-11 MED ORDER — LACTATED RINGERS IV SOLN
INTRAVENOUS | Status: DC | PRN
  Administered 2014-03-11 (×2): via INTRAVENOUS

## 2014-03-11 MED ORDER — BUPIVACAINE HCL (PF) 0.25 % IJ SOLN
INTRAMUSCULAR | Status: AC
  Filled 2014-03-11: qty 30

## 2014-03-11 MED ORDER — ONDANSETRON HCL 4 MG/2ML IJ SOLN
INTRAMUSCULAR | Status: AC
  Filled 2014-03-11: qty 2

## 2014-03-11 MED ORDER — MIDAZOLAM HCL 2 MG/2ML IJ SOLN
INTRAMUSCULAR | Status: AC
  Filled 2014-03-11: qty 2

## 2014-03-11 MED ORDER — OXYCODONE HCL 5 MG PO TABS: 5.0000 mg | ORAL_TABLET | Freq: Once | ORAL | Status: DC | PRN

## 2014-03-11 MED ORDER — PROPOFOL 10 MG/ML IV BOLUS
INTRAVENOUS | Status: AC
  Filled 2014-03-11: qty 20

## 2014-03-11 MED ORDER — HYDROMORPHONE HCL 1 MG/ML IJ SOLN
INTRAMUSCULAR | Status: AC
  Filled 2014-03-11: qty 1

## 2014-03-11 MED ORDER — LIDOCAINE HCL (CARDIAC) 20 MG/ML IV SOLN
INTRAVENOUS | Status: AC
  Filled 2014-03-11: qty 5

## 2014-03-11 MED ORDER — MIDAZOLAM HCL 2 MG/2ML IJ SOLN
INTRAMUSCULAR | Status: AC
  Administered 2014-03-11 (×2): 1 mg
  Filled 2014-03-11: qty 2

## 2014-03-11 MED ORDER — HYDROMORPHONE HCL 1 MG/ML IJ SOLN
0.5000 mg | INTRAMUSCULAR | Status: DC | PRN
  Administered 2014-03-12 – 2014-03-14 (×18): 1 mg via INTRAVENOUS
  Filled 2014-03-11 (×18): qty 1

## 2014-03-11 SURGICAL SUPPLY — 39 items
BAG DECANTER FOR FLEXI CONT (MISCELLANEOUS) ×3 IMPLANT
BANDAGE ELASTIC 3 VELCRO ST LF (GAUZE/BANDAGES/DRESSINGS) IMPLANT
BANDAGE ELASTIC 4 VELCRO ST LF (GAUZE/BANDAGES/DRESSINGS) ×3 IMPLANT
BNDG GAUZE ELAST 4 BULKY (GAUZE/BANDAGES/DRESSINGS) ×3 IMPLANT
CLOSURE STERI-STRIP 1/2X4 (GAUZE/BANDAGES/DRESSINGS) ×1
CLSR STERI-STRIP ANTIMIC 1/2X4 (GAUZE/BANDAGES/DRESSINGS) ×2 IMPLANT
CORDS BIPOLAR (ELECTRODE) ×3 IMPLANT
CUFF TOURNIQUET SINGLE 18IN (TOURNIQUET CUFF) ×3 IMPLANT
DRAPE SURG 17X23 STRL (DRAPES) ×3 IMPLANT
ELECT REM PT RETURN 9FT ADLT (ELECTROSURGICAL)
ELECTRODE REM PT RTRN 9FT ADLT (ELECTROSURGICAL) IMPLANT
GAUZE IODOFORM PACK 1/2 7832 (GAUZE/BANDAGES/DRESSINGS) ×3 IMPLANT
GAUZE PACKING IODOFORM 1/4X5 (PACKING) IMPLANT
GAUZE SPONGE 4X4 12PLY STRL (GAUZE/BANDAGES/DRESSINGS) ×3 IMPLANT
GLOVE BIOGEL M STRL SZ7.5 (GLOVE) ×3 IMPLANT
GLOVE SURG SS PI 7.0 STRL IVOR (GLOVE) ×3 IMPLANT
GOWN STRL REUS W/ TWL LRG LVL3 (GOWN DISPOSABLE) ×2 IMPLANT
GOWN STRL REUS W/TWL LRG LVL3 (GOWN DISPOSABLE) ×4
HANDPIECE INTERPULSE COAX TIP (DISPOSABLE) ×2
KIT BASIN OR (CUSTOM PROCEDURE TRAY) ×3 IMPLANT
KIT ROOM TURNOVER OR (KITS) ×3 IMPLANT
MANIFOLD NEPTUNE II (INSTRUMENTS) ×3 IMPLANT
NEEDLE HYPO 25GX1X1/2 BEV (NEEDLE) ×3 IMPLANT
NS IRRIG 1000ML POUR BTL (IV SOLUTION) ×3 IMPLANT
PACK ORTHO EXTREMITY (CUSTOM PROCEDURE TRAY) ×3 IMPLANT
PAD ARMBOARD 7.5X6 YLW CONV (MISCELLANEOUS) ×6 IMPLANT
PAD CAST 4YDX4 CTTN HI CHSV (CAST SUPPLIES) ×2 IMPLANT
PADDING CAST COTTON 4X4 STRL (CAST SUPPLIES) ×4
SET HNDPC FAN SPRY TIP SCT (DISPOSABLE) ×1 IMPLANT
SPONGE GAUZE 4X4 12PLY STER LF (GAUZE/BANDAGES/DRESSINGS) ×3 IMPLANT
SPONGE LAP 18X18 X RAY DECT (DISPOSABLE) ×3 IMPLANT
SUT ETHILON 2 0 PSLX (SUTURE) ×3 IMPLANT
SYR CONTROL 10ML LL (SYRINGE) ×3 IMPLANT
TOWEL OR 17X24 6PK STRL BLUE (TOWEL DISPOSABLE) ×3 IMPLANT
TOWEL OR 17X26 10 PK STRL BLUE (TOWEL DISPOSABLE) ×3 IMPLANT
TUBE ANAEROBIC SPECIMEN COL (MISCELLANEOUS) ×3 IMPLANT
TUBE CONNECTING 12'X1/4 (SUCTIONS) ×1
TUBE CONNECTING 12X1/4 (SUCTIONS) ×2 IMPLANT
YANKAUER SUCT BULB TIP NO VENT (SUCTIONS) ×3 IMPLANT

## 2014-03-11 NOTE — H&P (Signed)
Reason for Consult:R arm infection Referring Physician: ER  CC: My elbow hurts  HPI:  Cody Sims. is an 33 y.o. right handed male who presents with  Injury to R elbow ~1wk ago, presented to ER and left without being evaluated.  C/o since then R elbow extremely painful and has started draining infection       .   Pain is rated at   10 /10 and is described as sharp/dull.  Pain is constant.  Pain is made better by rest/immobilization, worse with motion.   Associated signs/symptoms: denies fever , chills; denines resistant infections in past Previous treatment:    Past Medical History  Diagnosis Date  . Chronic pain     History reviewed. No pertinent past surgical history.  Family History  Problem Relation Age of Onset  . Arthritis Mother     cervical DDD req anterior fusion  . Arthritis Father     Social History:  reports that he has been smoking Cigarettes.  He has been smoking about 1.00 pack per day. He does not have any smokeless tobacco history on file. He reports that he does not drink alcohol or use illicit drugs.  Allergies: No Known Allergies  Medications: I have reviewed the patient's current medications.  Results for orders placed or performed during the hospital encounter of 03/11/14 (from the past 48 hour(s))  CBC with Differential     Status: Abnormal   Collection Time: 03/11/14 12:59 PM  Result Value Ref Range   WBC 11.7 (H) 4.0 - 10.5 K/uL   RBC 4.14 (L) 4.22 - 5.81 MIL/uL   Hemoglobin 13.9 13.0 - 17.0 g/dL   HCT 16.140.9 09.639.0 - 04.552.0 %   MCV 98.8 78.0 - 100.0 fL   MCH 33.6 26.0 - 34.0 pg   MCHC 34.0 30.0 - 36.0 g/dL   RDW 40.913.1 81.111.5 - 91.415.5 %   Platelets 390 150 - 400 K/uL   Neutrophils Relative % 62 43 - 77 %   Lymphocytes Relative 26 12 - 46 %   Monocytes Relative 11 3 - 12 %   Eosinophils Relative 1 0 - 5 %   Basophils Relative 0 0 - 1 %   Neutro Abs 7.3 1.7 - 7.7 K/uL   Lymphs Abs 3.0 0.7 - 4.0 K/uL   Monocytes Absolute 1.3 (H) 0.1 - 1.0 K/uL   Eosinophils  Absolute 0.1 0.0 - 0.7 K/uL   Basophils Absolute 0.0 0.0 - 0.1 K/uL   WBC Morphology ATYPICAL LYMPHOCYTES     Comment: TOXIC GRANULATION    Dg Elbow Complete Right  03/11/2014   CLINICAL DATA:  Swelling and redness with open wound following injury with elbow entangled in machinery.  EXAM: RIGHT ELBOW - COMPLETE 3+ VIEW  COMPARISON:  None.  FINDINGS: Frontal, lateral, and bilateral oblique views were obtained. There is soft tissue swelling lateral and posterior to the distal humerus. Air within the soft tissues in this area may represent cellulitis or developing early abscess. There is no fracture, dislocation, or joint effusion. No erosive change or bony destruction. No radiopaque foreign body.  IMPRESSION: Questions cellulitis versus early abscess formation posterior lateral to the distal humerus. There is marked soft tissue swelling in this area. There may be some hematoma in this area as well. No bony abnormality. No fracture or dislocation. No effusion.   Electronically Signed   By: Bretta BangWilliam  Woodruff M.D.   On: 03/11/2014 14:48   Dg Humerus Right  03/11/2014   CLINICAL  DATA:  Swelling and erythema along the elbow posteriorly with open wound draining after gating the elbow caudad machinery last week. Pain. Abscess.  EXAM: RIGHT HUMERUS - 2+ VIEW  COMPARISON:  03/11/2014  FINDINGS: There is a small amount of gas in the subcutaneous tissues lateral and dorsal to the distal humeral shaft, with associated soft tissue swelling. No underlying osseous abnormality observed.  IMPRESSION: 1. Gas in the lateral and dorsal soft tissues along the distal humeral shaft, likely reflecting the known abscess. Soft tissue swelling is noted especially dorsally. If further characterization by imaging is warranted, MRI with and without contrast may be helpful.   Electronically Signed   By: Herbie BaltimoreWalt  Liebkemann M.D.   On: 03/11/2014 14:50    Pertinent items are noted in HPI. Temp:  [98.2 F (36.8 C)] 98.2 F (36.8 C)  (12/21 1221) Pulse Rate:  [61-98] 66 (12/21 1608) Resp:  [14-18] 14 (12/21 1608) BP: (87-126)/(53-72) 94/58 mmHg (12/21 1608) SpO2:  [97 %-100 %] 98 % (12/21 1608) General appearance: alert and cooperative Resp: clear to auscultation bilaterally Cardio: regular rate and rhythm GI: soft, non-tender; bowel sounds normal; no masses,  no organomegaly Extremities: extremities normal, atraumatic, no cyanosis or edema  Except fo R elbow with erythema draining pus anterio-medially, tight with elbow flexion, but not extremely tender, distally n/v intact   Assessment: Infection, abscess R elbow Plan: I&D I have discussed this treatment plan in detail with patient, including the risks of the recommended treatment or surgery, the benefits and the alternatives.  The patient understands that additional treatment may be necessary.  Cody Sims CHRISTOPHER 03/11/2014, 6:12 PM

## 2014-03-11 NOTE — ED Provider Notes (Signed)
CSN: [id number]     Arrival date & time 03/11/14  1208 History   First MD Initiated Contact with Patient 03/11/14 1223     Chief Complaint  Patient presents with  . Arm Pain     (Consider location/radiation/quality/duration/timing/severity/associated sxs/prior Treatment) HPI  Olean ReeLoy Onnen Jr. is a 33 y.o. male with PMH of chronic pain presenting with one week after an injury or patient got his arm stuck in a call bell's. He is presenting with swollen red fluctuant mass above right elbow that has spontaneous drainage that is foul-smelling and purulent. Patient denies fever, chills, nausea, vomiting. He is only taking ibuprofen for it with minimal relief. He presented to the ED about a week ago but was not evaluated he said the wait was too long. He has not had any evaluation of this arm. He denies any numbness, tingling, weakness. He states he can move his arm but is just significantly painful. He denies any IV drug use, recreational drug use, alcohol use. Pt denies history of DM. He is a current smoker   Past Medical History  Diagnosis Date  . Chronic pain    History reviewed. No pertinent past surgical history. Family History  Problem Relation Age of Onset  . Arthritis Mother     cervical DDD req anterior fusion  . Arthritis Father    History  Substance Use Topics  . Smoking status: Current Every Day Smoker -- 1.00 packs/day    Types: Cigarettes  . Smokeless tobacco: Not on file  . Alcohol Use: No    Review of Systems  Constitutional: Negative for fever and chills.  Respiratory: Negative for cough and shortness of breath.   Cardiovascular: Negative for chest pain and palpitations.  Gastrointestinal: Negative for nausea, vomiting and diarrhea.  Musculoskeletal: Negative for back pain and joint swelling.  Skin: Positive for wound. Negative for rash.  Neurological: Negative for weakness and numbness.  Psychiatric/Behavioral: Negative for confusion and sleep disturbance.       Allergies  Review of patient's allergies indicates no known allergies.  Home Medications   Prior to Admission medications   Medication Sig Start Date End Date Taking? Authorizing Provider  ibuprofen (ADVIL,MOTRIN) 400 MG tablet Take 400 mg by mouth every 6 (six) hours as needed for mild pain or moderate pain.   Yes Historical Provider, MD  Neomycin-Bacitracin-Polymyxin (TRIPLE ANTIBIOTIC) [phone] OINT Apply 1 application topically once.   Yes Historical Provider, MD  cyclobenzaprine (FLEXERIL) 5 MG tablet Take 1 tablet (5 mg total) by mouth 3 (three) times daily as needed for muscle spasms. Patient not taking: Reported on 03/11/2014 01/19/14   Reuben Likesavid C Keller, MD  diazepam (VALIUM) 5 MG tablet Take 1 tablet (5 mg total) by mouth every 12 (twelve) hours as needed for anxiety. Patient not taking: Reported on 03/11/2014 11/15/13   Graylon GoodZachary H Baker, PA-C  DULoxetine (CYMBALTA) 60 MG capsule Take 1 capsule (60 mg total) by mouth daily. Patient not taking: Reported on 03/11/2014 07/12/13   Pearline CablesJessica C Copland, MD  gabapentin (NEURONTIN) 300 MG capsule 1 daily for 3 days, 1 BID for 3 days, 1 TID thereafter Patient not taking: Reported on 03/11/2014 01/19/14   Reuben Likesavid C Keller, MD  meloxicam (MOBIC) 15 MG tablet Take 1 tablet (15 mg total) by mouth daily. Patient not taking: Reported on 03/11/2014 01/19/14   Reuben Likesavid C Keller, MD  oxyCODONE (OXY IR/ROXICODONE) 5 MG immediate release tablet Take 1 tablet (5 mg total) by mouth every 6 (six) hours as needed  for moderate pain or severe pain. Patient not taking: Reported on 03/11/2014 11/15/13   Graylon GoodZachary H Baker, PA-C  predniSONE (DELTASONE) 20 MG tablet Take 2 tablets (40 mg total) by mouth daily. Patient not taking: Reported on 03/11/2014 01/03/14   Mora BellmanHannah S Merrell, PA-C   BP 94/58 mmHg  Pulse 66  Temp(Src) 98.2 F (36.8 C)  Resp 14  SpO2 98% Physical Exam  Constitutional: He appears well-developed and well-nourished. No distress.  HENT:   Head: Normocephalic and atraumatic.  Eyes: Conjunctivae are normal. Right eye exhibits no discharge. Left eye exhibits no discharge.  Cardiovascular:  2+ radial pulses equal bilaterally less than 3 second cap refill.  Pulmonary/Chest: Effort normal. No respiratory distress.  Neurological: He is alert. Coordination normal.  5/5 grip strength bilaterally. Pt with intact bicep and tricep strength. Pt with FROM of wrist, with intact supination and pronation. Pt unable to fully extend elbow due to pain. Sensation intact.  Skin: He is not diaphoretic.  Patient with large fluctuant mass to right arm above elbow with erythema consistent with cellulitis. Patient with spontaneous drainage on more medial side with purulent discharge without odor.  Psychiatric: He has a normal mood and affect. His behavior is normal.  Nursing note and vitals reviewed.   ED Course  Procedures (including critical care time) Labs Review Labs Reviewed  CBC WITH DIFFERENTIAL - Abnormal; Notable for the following:    WBC 11.7 (*)    RBC 4.14 (*)    Monocytes Absolute 1.3 (*)    All other components within normal limits  CULTURE, ROUTINE-ABSCESS  WOUND CULTURE  I-STAT CHEM 8, ED    Imaging Review Dg Elbow Complete Right  03/11/2014   CLINICAL DATA:  Swelling and redness with open wound following injury with elbow entangled in machinery.  EXAM: RIGHT ELBOW - COMPLETE 3+ VIEW  COMPARISON:  None.  FINDINGS: Frontal, lateral, and bilateral oblique views were obtained. There is soft tissue swelling lateral and posterior to the distal humerus. Air within the soft tissues in this area may represent cellulitis or developing early abscess. There is no fracture, dislocation, or joint effusion. No erosive change or bony destruction. No radiopaque foreign body.  IMPRESSION: Questions cellulitis versus early abscess formation posterior lateral to the distal humerus. There is marked soft tissue swelling in this area. There may be  some hematoma in this area as well. No bony abnormality. No fracture or dislocation. No effusion.   Electronically Signed   By: Bretta BangWilliam  Woodruff M.D.   On: 03/11/2014 14:48   Dg Humerus Right  03/11/2014   CLINICAL DATA:  Swelling and erythema along the elbow posteriorly with open wound draining after gating the elbow caudad machinery last week. Pain. Abscess.  EXAM: RIGHT HUMERUS - 2+ VIEW  COMPARISON:  03/11/2014  FINDINGS: There is a small amount of gas in the subcutaneous tissues lateral and dorsal to the distal humeral shaft, with associated soft tissue swelling. No underlying osseous abnormality observed.  IMPRESSION: 1. Gas in the lateral and dorsal soft tissues along the distal humeral shaft, likely reflecting the known abscess. Soft tissue swelling is noted especially dorsally. If further characterization by imaging is warranted, MRI with and without contrast may be helpful.   Electronically Signed   By: Herbie BaltimoreWalt  Liebkemann M.D.   On: 03/11/2014 14:50     EKG Interpretation None      MDM   Final diagnoses:  Abscess  Abscess of arm, right  Arm injury, right, initial encounter  Patient with injury at work one week ago with right arm infection to right elbow with swelling, spontaneous drainage that is personal and foul-smelling. Patient without fever, chills, nausea, vomiting. Neurovascularly intact on exam. Patient with limited extension of right arm likely due to pain and swelling. Patient with leukocytosis of 11.7. X-ray without any evidence of fracture but there is evidence of abscess as well as gas in the subcutaneous tissue. Consult to orthopedics. Dr. Eulah Pont in surgery with a case scheduled after it. Consult to hand Dr. Radford Pax spoke with Dr. Izora Ribas who will take her to surgery for cervical I&D. Patient currently in no distressed vital signs stable. Wound culture pending. 2 g of Ancef administered. Pain managed in ED. Plan for likely admission.   Discussed all results and patient  verbalizes understanding and agrees with plan.  This is a shared patient. This patient was discussed with the physician, Dr. Radford Pax who saw and evaluated the patient and agrees with the plan.     Louann Sjogren, PA-C 03/11/14 1736  Nelia Shi, MD 03/18/14 [phone]

## 2014-03-11 NOTE — Transfer of Care (Signed)
Immediate Anesthesia Transfer of Care Note  Patient: Cody ReeLoy Alldredge Jr.  Procedure(s) Performed: Procedure(s): IRRIGATION AND DEBRIDEMENT EXTREMITY (Right)  Patient Location: PACU  Anesthesia Type:General  Level of Consciousness: awake, alert  and oriented  Airway & Oxygen Therapy: Patient Spontanous Breathing  Post-op Assessment: Report given to PACU RN, Post -op Vital signs reviewed and stable and Patient moving all extremities X 4  Post vital signs: Reviewed and stable  Complications: No apparent anesthesia complications

## 2014-03-11 NOTE — ED Notes (Signed)
Patient transported to X-ray 

## 2014-03-11 NOTE — ED Notes (Signed)
Pt here with right arm infection. sts injured at work last week. Pt arm very swollen, red and foul smelling drainage from arm.

## 2014-03-11 NOTE — Progress Notes (Signed)
ANTIBIOTIC CONSULT NOTE - INITIAL  Pharmacy Consult for Vancomycin Indication: Hand infection  No Known Allergies  Patient Measurements:   Adjusted Body Weight: n/a  Vital Signs: Temp: 98.2 F (36.8 C) (12/21 1221) BP: 94/58 mmHg (12/21 1608) Pulse Rate: 66 (12/21 1608) Intake/Output from previous day:   Intake/Output from this shift: Total I/O In: 1000 [I.V.:1000] Out: -   Labs:  Recent Labs  03/11/14 1259  WBC 11.7*  HGB 13.9  PLT 390   Estimated Creatinine Clearance: 116.1 mL/min (by C-G formula based on Cr of 0.83). No results for input(s): VANCOTROUGH, VANCOPEAK, VANCORANDOM, GENTTROUGH, GENTPEAK, GENTRANDOM, TOBRATROUGH, TOBRAPEAK, TOBRARND, AMIKACINPEAK, AMIKACINTROU, AMIKACIN in the last 72 hours.   Microbiology: No results found for this or any previous visit (from the past 720 hour(s)).  Medical History: Past Medical History  Diagnosis Date  . Chronic pain      Assessment: 33 yo male admitted after R elbow injury, now has draining infection.  Found with abscess in R elbow.  Pharmacy asked to begin empiric vancomycin s/p I&D today.  Ancef given pre-op.  Goal of Therapy:  Vancomycin trough level 15-20 mcg/ml  Plan:  1. Vancomycin 1g IV q 8 hrs. 2. Will check vancomycin trough level at steady state as indicated. 3. F/u cultures and LOT.  Tad MooreJessica Dustine Bertini, Pharm D, BCPS  Clinical Pharmacist Pager [phone]  03/11/2014 7:37 PM

## 2014-03-11 NOTE — Anesthesia Postprocedure Evaluation (Signed)
  Anesthesia Post-op Note  Patient: Cody ReeLoy Daily Jr.  Procedure(s) Performed: Procedure(s): IRRIGATION AND DEBRIDEMENT EXTREMITY (Right)  Patient Location: PACU  Anesthesia Type:General  Level of Consciousness: awake and alert   Airway and Oxygen Therapy: Patient Spontanous Breathing  Post-op Pain: none  Post-op Assessment: Post-op Vital signs reviewed  Post-op Vital Signs: Reviewed  Last Vitals:  Filed Vitals:   03/11/14 2121  BP: 101/62  Pulse: 72  Temp: 37 C  Resp: 12    Complications: No apparent anesthesia complications

## 2014-03-11 NOTE — Anesthesia Preprocedure Evaluation (Addendum)
Anesthesia Evaluation  Patient identified by MRN, date of birth, ID band Patient awake    Reviewed: Allergy & Precautions, H&P , NPO status , Patient's Chart, lab work & pertinent test results  Airway Mallampati: II  TM Distance: >3 FB Neck ROM: Full    Dental  (+) Teeth Intact, Dental Advisory Given   Pulmonary Current Smoker,          Cardiovascular negative cardio ROS      Neuro/Psych Anxiety  Neuromuscular disease    GI/Hepatic negative GI ROS, Neg liver ROS,   Endo/Other  negative endocrine ROS  Renal/GU negative Renal ROS     Musculoskeletal   Abdominal   Peds  Hematology negative hematology ROS (+)   Anesthesia Other Findings   Reproductive/Obstetrics                            Anesthesia Physical Anesthesia Plan  ASA: II  Anesthesia Plan: General   Post-op Pain Management:    Induction: Intravenous  Airway Management Planned: LMA and Oral ETT  Additional Equipment:   Intra-op Plan:   Post-operative Plan: Extubation in OR  Informed Consent: I have reviewed the patients History and Physical, chart, labs and discussed the procedure including the risks, benefits and alternatives for the proposed anesthesia with the patient or authorized representative who has indicated his/her understanding and acceptance.   Dental advisory given  Plan Discussed with: CRNA and Surgeon  Anesthesia Plan Comments:         Anesthesia Quick Evaluation

## 2014-03-12 ENCOUNTER — Encounter (HOSPITAL_COMMUNITY): Payer: Self-pay | Admitting: General Surgery

## 2014-03-12 MED ORDER — HYDROMORPHONE HCL 1 MG/ML IJ SOLN
2.0000 mg | Freq: Once | INTRAMUSCULAR | Status: AC
  Administered 2014-03-12: 2 mg via INTRAVENOUS
  Filled 2014-03-12: qty 2

## 2014-03-12 MED ORDER — LIDOCAINE-EPINEPHRINE 1 %-1:100000 IJ SOLN
30.0000 mL | Freq: Once | INTRAMUSCULAR | Status: AC
  Administered 2014-03-12: 30 mL via INTRADERMAL
  Filled 2014-03-12: qty 30

## 2014-03-12 MED ORDER — PIPERACILLIN-TAZOBACTAM 3.375 G IVPB
3.3750 g | Freq: Three times a day (TID) | INTRAVENOUS | Status: DC
  Administered 2014-03-12 – 2014-03-14 (×6): 3.375 g via INTRAVENOUS
  Filled 2014-03-12 (×7): qty 50

## 2014-03-12 NOTE — Progress Notes (Signed)
UR completed 

## 2014-03-12 NOTE — Progress Notes (Signed)
ANTIBIOTIC CONSULT NOTE - INITIAL  Pharmacy Consult for Zosyn Indication: R arm infection  No Known Allergies  Patient Measurements: Height: 5\' 7"  (170.2 cm) Weight: 144 lb 11.2 oz (65.635 kg) IBW/kg (Calculated) : 66.1  Vital Signs: Temp: 98.4 F (36.9 C) (12/22 1330) Temp Source: Oral (12/22 1330) BP: 119/77 mmHg (12/22 1330) Pulse Rate: 88 (12/22 1330) Intake/Output from previous day: 12/21 0701 - 12/22 0700 In: 1960 [P.O.:960; I.V.:1000] Out: 1150 [Urine:1150] Intake/Output from this shift: Total I/O In: -  Out: 700 [Urine:700]  Labs:  Recent Labs  03/11/14 1259  WBC 11.7*  HGB 13.9  PLT 390   Estimated Creatinine Clearance: 115.3 mL/min (by C-G formula based on Cr of 0.83). No results for input(s): VANCOTROUGH, VANCOPEAK, VANCORANDOM, GENTTROUGH, GENTPEAK, GENTRANDOM, TOBRATROUGH, TOBRAPEAK, TOBRARND, AMIKACINPEAK, AMIKACINTROU, AMIKACIN in the last 72 hours.   Microbiology: Recent Results (from the past 720 hour(s))  Wound culture     Status: None (Preliminary result)   Collection Time: 03/11/14  5:20 PM  Result Value Ref Range Status   Specimen Description WOUND RIGHT ELBOW  Final   Special Requests NORMAL  Final   Gram Stain   Final    FEW WBC PRESENT,BOTH PMN AND MONONUCLEAR NO SQUAMOUS EPITHELIAL CELLS SEEN FEW GRAM NEGATIVE RODS Performed at [facility]    Culture   Final    NO GROWTH 1 DAY Performed at [facility]    Report Status PENDING  Incomplete  Anaerobic culture     Status: None (Preliminary result)   Collection Time: 03/11/14  7:08 PM  Result Value Ref Range Status   Specimen Description ABSCESS  Final   Special Requests RIGHT ELBOW PATIENT ON FOLLOWING ANCEF  Final   Gram Stain PENDING  Incomplete   Culture   Final    NO ANAEROBES ISOLATED; CULTURE IN PROGRESS FOR 5 DAYS Performed at [facility]    Report Status PENDING  Incomplete    Medical History: Past Medical History  Diagnosis Date  .  Chronic pain     Medications:  Prescriptions prior to admission  Medication Sig Dispense Refill Last Dose  . ibuprofen (ADVIL,MOTRIN) 400 MG tablet Take 400 mg by mouth every 6 (six) hours as needed for mild pain or moderate pain.   03/11/2014 at Unknown time  . Neomycin-Bacitracin-Polymyxin (TRIPLE ANTIBIOTIC) [phone] OINT Apply 1 application topically once.   03/09/2014  . cyclobenzaprine (FLEXERIL) 5 MG tablet Take 1 tablet (5 mg total) by mouth 3 (three) times daily as needed for muscle spasms. (Patient not taking: Reported on 03/11/2014) 30 tablet 0 Completed Course at Unknown time  . diazepam (VALIUM) 5 MG tablet Take 1 tablet (5 mg total) by mouth every 12 (twelve) hours as needed for anxiety. (Patient not taking: Reported on 03/11/2014) 30 tablet 0 Completed Course at Unknown time  . DULoxetine (CYMBALTA) 60 MG capsule Take 1 capsule (60 mg total) by mouth daily. (Patient not taking: Reported on 03/11/2014) 30 capsule 5 Completed Course at Unknown time  . gabapentin (NEURONTIN) 300 MG capsule 1 daily for 3 days, 1 BID for 3 days, 1 TID thereafter (Patient not taking: Reported on 03/11/2014) 90 capsule 0 Completed Course at Unknown time  . meloxicam (MOBIC) 15 MG tablet Take 1 tablet (15 mg total) by mouth daily. (Patient not taking: Reported on 03/11/2014) 15 tablet 0 Completed Course at Unknown time  . oxyCODONE (OXY IR/ROXICODONE) 5 MG immediate release tablet Take 1 tablet (5 mg total) by mouth every 6 (  six) hours as needed for moderate pain or severe pain. (Patient not taking: Reported on 03/11/2014) 30 tablet 0 Completed Course at Unknown time  . predniSONE (DELTASONE) 20 MG tablet Take 2 tablets (40 mg total) by mouth daily. (Patient not taking: Reported on 03/11/2014) 10 tablet 0 Completed Course at Unknown time   Assessment: 33 yo male admitted after R elbow injury, now has draining infection. Found with abscess in R elbow s/p I&D yesterday. He is already on vancomycin.  Pharmacy consulted to begin Zosyn. He is afebrile, WBC are trending down, and renal function is normal. R elbow wound has few GNR in gram stain.  Goal of Therapy:  Eradication of infection  Plan:  - Zosyn 3.375 g IV q8h to be infused over 4 hours - Monitor renal function and culture data  [facility] CenterJennifer Carmichaels, [facility] FarmsPharm.D., BCPS Clinical Pharmacist Pager: [phone] 03/12/2014 3:49 PM

## 2014-03-12 NOTE — Op Note (Signed)
[name], [name]                     ACCOUNT NO.:  [id number]  MEDICAL RECORD NO.:  [id number]  LOCATION:  [id number]                        FACILITY:  [facility]  PHYSICIAN:  Johnette AbrahamHarrill C Arron Tetrault, MD    DATE OF BIRTH:  Jul 29, 1980  DATE OF PROCEDURE:  03/11/2014 DATE OF DISCHARGE:                              OPERATIVE REPORT   PREOPERATIVE DIAGNOSES:  Cellulitis and presumed infection of the right elbow.  POSTOPERATIVE DIAGNOSES:  Cellulitis and presumed infection of the right elbow.  PROCEDURE:  Incision and drainage of abscess of the right elbow.  INDICATIONS:  Cody HoughLoy Sims is a 33 year old gentleman who sustained some type of an injury about approximately a week ago.  Since that time, he has noticed an increase in pain, swelling, and most recently a draining open area to his right elbow draining pus.  He presented to the ER.  I was consulted for probable abscess.  On evaluation, he did not have evidence of a septic joint but severe soft tissue swelling and erythema with a draining sinus on the anterolateral aspect of his right elbow.  Risks, benefits, and alternatives of surgery and I and D were discussed with him and agreed to proceed.  Consent was obtained.  PROCEDURE:  The patient was taken to the operating room, placed supine on the operating table.  Time-out was performed.  Extremity was prepped and draped in normal sterile fashion.  The open wound on the anterolateral aspect of the right elbow was draining foul-smelling pus. A curvilinear incision was made along this lateral aspect of the elbow, immediately there was copious amounts of pus that was obtained.  There was a cavity that tracked a little bit more posterior.  The infection seemed to be superficial and not involve the deep structures where the joint was.  Thorough irrigation with pulse lavage and approximately 3 L of saline were then performed.  Afterwards, hemostasis was obtained. The wound was packed with iodoform gauze and  sterile dressing was placed.  0.5% plain Marcaine was infiltrated around the wound to aid with postoperative pain control.  The patient tolerated the procedure well and was taken to recovery room in stable condition.  He will receive high-dose therapy and placed on IV antibiotics until the infection is significantly better.     Johnette AbrahamHarrill C Ellianna Ruest, MD     HCC/MEDQ  D:  03/11/2014  T:  03/12/2014  Job:  [id number]

## 2014-03-12 NOTE — Progress Notes (Signed)
S:poor pain control, unable to tolerate hydrotherapy, packing removed  O:Blood pressure 119/77, pulse 88, temperature 98.4 F (36.9 C), temperature source Oral, resp. rate 17, height 5\' 7"  (1.702 m), weight 65.635 kg (144 lb 11.2 oz), SpO2 100 %.  R arm /elbow with less erythema, some fluid collection posteriorly, still some serosanginous, purulent drainage  A:  S/p I&D abscess R elbow   P:arm cleansed, local infiltrated posterior elbow where evidence of fluid collection. Additional I&D performed at bedside, some sero purulent drainage evacuated, wound irrigated, penrose drain placed.  Now able to irrigate from above with drainage area posteriorly; cont to irrigate wound , dressing changes.  Cont abx, will add zosyn as G neg rods on G stain

## 2014-03-12 NOTE — Progress Notes (Signed)
Physical Therapy Wound Treatment and EVALUATION Patient Details  Name: Cody Sims. MRN: [id number] Date of Birth: 15-Mar-1981  Today's Date: 03/12/2014 Time: 4034-7425 Time Calculation (min): 31 min  Subjective     Pain Score: Pain Score: 10-Worst pain ever  Wound Assessment  Wound / Incision (Open or Dehisced) 03/12/14 Incision - Open Elbow Right;Anterior;Posterior;Lateral open wound anterior Right elbow through to posterior elbow. (Active)  Dressing Type Impregnated gauze (bismuth);Gauze (Comment);Compression wrap 03/12/2014 12:00 PM  Dressing Changed Changed 03/12/2014 12:00 PM  Dressing Status Clean;Dry;Intact 03/12/2014 12:00 PM  Dressing Change Frequency Daily 03/12/2014 12:00 PM  Site / Wound Assessment Bleeding;Red;Painful 03/12/2014 12:00 PM  % Wound base Red or Granulating 95% 03/12/2014 12:00 PM  % Wound base Yellow 5% 03/12/2014 12:00 PM  % Wound base Black 0% 03/12/2014 12:00 PM  % Wound base Other (Comment) 0% 03/12/2014 12:00 PM  Peri-wound Assessment Erythema (blanchable);Edema 03/12/2014 12:00 PM  Wound Length (cm) 1 cm 03/12/2014 12:00 PM  Wound Width (cm) 1 cm 03/12/2014 12:00 PM  Wound Depth (cm) 0.2 cm 03/12/2014 12:00 PM  Tunneling (cm) unable to determine due to pain.  Removed lots of packing 03/12/2014 12:00 PM  Margins Unattached edges (unapproximated) 03/12/2014 12:00 PM  Drainage Amount Copious 03/12/2014 12:00 PM  Drainage Description Sanguineous;Purulent 03/12/2014 12:00 PM  Treatment Cleansed;Hydrotherapy (Pulse lavage);Irrigation 03/12/2014 12:00 PM     Incision (Closed) 03/11/14 Other (Comment) Right (Active)  Dressing Type Compression wrap 03/11/2014  9:53 PM  Dressing Clean;Dry;Intact 03/11/2014  9:53 PM  Drainage Amount None 03/11/2014  9:53 PM   Hydrotherapy Pulsed lavage therapy - wound location: R elbow Pulsed Lavage with Suction (psi): 4 psi Pulsed Lavage with Suction - Normal Saline Used: 1000 mL Pulsed Lavage Tip: Tip with splash  shield   Wound Assessment and Plan  Wound Therapy - Assess/Plan/Recommendations Wound Therapy - Clinical Statement: Pt could benefit from Pulsed Lavage to cleanse the wound, but pt was not able to tolerate more than a gentle spray on the outside of the wound.  Best technique would be with tunneling tip, but do not believe pt would hand the pain. Wound Therapy - Functional Problem List: limited elbow range--started AROM in therapy session Factors Delaying/Impairing Wound Healing: Infection - systemic/local;Tobacco use Hydrotherapy Plan: Debridement;Dressing change;Patient/family education;Pulsatile lavage with suction Wound Therapy - Frequency: 6X / week Wound Therapy - Follow Up Recommendations: Home health RN Wound Plan: see above  Wound Therapy Goals- Improve the function of patient's integumentary system by progressing the wound(s) through the phases of wound healing (inflammation - proliferation - remodeling) by: Decrease Necrotic Tissue to: 0 Decrease Necrotic Tissue - Progress: Goal set today Increase Granulation Tissue to: 100% Increase Granulation Tissue - Progress: Goal set today Improve Drainage Characteristics: Min;Serous Improve Drainage Characteristics - Progress: Goal set today  Goals will be updated until maximal potential achieved or discharge criteria met.  Discharge criteria: when goals achieved, discharge from hospital, MD decision/surgical intervention, no progress towards goals, refusal/missing three consecutive treatments without notification or medical reason.  GP     Kairi Tufo, [name] 03/12/2014, 12:49 PM 03/12/2014  [name], [place] [phone]  (pager)

## 2014-03-13 LAB — CBC
HCT: 36.4 % — ABNORMAL LOW (ref 39.0–52.0)
Hemoglobin: 11.6 g/dL — ABNORMAL LOW (ref 13.0–17.0)
MCH: 31.4 pg (ref 26.0–34.0)
MCHC: 31.9 g/dL (ref 30.0–36.0)
MCV: 98.4 fL (ref 78.0–100.0)
PLATELETS: 333 10*3/uL (ref 150–400)
RBC: 3.7 MIL/uL — ABNORMAL LOW (ref 4.22–5.81)
RDW: 13.4 % (ref 11.5–15.5)
WBC: 7 10*3/uL (ref 4.0–10.5)

## 2014-03-13 MED ORDER — HYDROMORPHONE HCL 1 MG/ML IJ SOLN
2.0000 mg | Freq: Three times a day (TID) | INTRAMUSCULAR | Status: DC
Start: 2014-03-14 — End: 2014-03-14
  Administered 2014-03-14: 2 mg via INTRAVENOUS
  Filled 2014-03-13: qty 2

## 2014-03-13 NOTE — Progress Notes (Signed)
S:arm feels better, hydrotherapy this am better  O:Blood pressure 112/61, pulse 71, temperature 98.8 F (37.1 C), temperature source Oral, resp. rate 18, height 5\' 7"  (1.702 m), weight 65.635 kg (144 lb 11.2 oz), SpO2 100 %.  Decreased swelling, decreased erythema, still some drainage, drain posteriorly functional cx's Group C strept, sens pending  A:s/p multiple I&D R elbow   P:cont wound care, arrange for family to observe dressing change tomorrow, f/u c'x for oral abx

## 2014-03-13 NOTE — Progress Notes (Signed)
Physical Therapy Wound Treatment Patient Details  Name: Cody Sims. MRN: [id number] Date of Birth: 09-Jun-1980  Today's Date: 03/13/2014 Time: 8335-8251 Time Calculation (min): 38 min  Subjective  Subjective: I'm sorry man, but this hurts more than you can know. Patient and Family Stated Goals: Healed up and back to work  Pain Score: Pain Score: 6   Wound Assessment  Wound / Incision (Open or Dehisced) 03/12/14 Incision - Open Elbow Right;Anterior;Posterior;Lateral open wound anterior Right elbow through to posterior elbow. (Active)  Dressing Type Impregnated gauze (bismuth);Gauze (Comment);Compression wrap 03/13/2014 10:38 AM  Dressing Changed Changed 03/13/2014 10:38 AM  Dressing Status Clean;Dry;Intact 03/13/2014 10:38 AM  Dressing Change Frequency Other (Comment) 03/13/2014 10:38 AM  Site / Wound Assessment Bleeding;Red;Painful 03/13/2014 10:38 AM  % Wound base Red or Granulating 95% 03/13/2014 10:38 AM  % Wound base Yellow 5% 03/13/2014 10:38 AM  % Wound base Black 0% 03/13/2014 10:38 AM  % Wound base Other (Comment) 0% 03/13/2014 10:38 AM  Peri-wound Assessment Erythema (blanchable);Edema 03/13/2014 10:38 AM  Wound Length (cm) 1 cm 03/12/2014 12:00 PM  Wound Width (cm) 1 cm 03/12/2014 12:00 PM  Wound Depth (cm) 0.2 cm 03/12/2014 12:00 PM  Tunneling (cm) able to get 6 cm of tubing into wound, 03/13/2014 10:38 AM  Margins Unattached edges (unapproximated) 03/13/2014 10:38 AM  Closure Sutures 03/13/2014 10:38 AM  Drainage Amount Copious 03/13/2014 10:38 AM  Drainage Description Purulent;Serous 03/13/2014 10:38 AM  Treatment Cleansed;Hydrotherapy (Pulse lavage) 03/13/2014 10:38 AM     Incision (Closed) 03/11/14 Other (Comment) Right (Active)  Dressing Type Other (Comment) 03/13/2014  8:25 AM  Dressing Clean;Dry;Intact 03/13/2014  8:25 AM  Dressing Change Frequency Twice a day 03/13/2014  8:25 AM  Site / Wound Assessment Dressing in place / Unable to assess 03/13/2014  8:25 AM   Drainage Amount None 03/13/2014  8:25 AM   Hydrotherapy Pulsed lavage therapy - wound location: R elbow Pulsed Lavage with Suction (psi): 4 psi Pulsed Lavage with Suction - Normal Saline Used: 1000 mL Pulsed Lavage Tip: Tunneling tip   Wound Assessment and Plan  Wound Therapy - Assess/Plan/Recommendations Wound Therapy - Clinical Statement: Pt could benefit from Pulsed Lavage to cleanse the wound, but pt was not able to tolerate more than a gentle spray on the outside of the wound.  Best technique would be with tunneling tip, but do not believe pt would hand the pain. Wound Therapy - Functional Problem List: limited elbow range--started AROM in therapy session Factors Delaying/Impairing Wound Healing: Infection - systemic/local;Tobacco use Hydrotherapy Plan: Debridement;Dressing change;Patient/family education;Pulsatile lavage with suction Wound Therapy - Frequency: 6X / week Wound Therapy - Follow Up Recommendations: Home health RN Wound Plan: see above  Wound Therapy Goals- Improve the function of patient's integumentary system by progressing the wound(s) through the phases of wound healing (inflammation - proliferation - remodeling) by: Decrease Necrotic Tissue to: 0 Decrease Necrotic Tissue - Progress: Progressing toward goal Increase Granulation Tissue to: 100% Increase Granulation Tissue - Progress: Progressing toward goal Improve Drainage Characteristics: Min;Serous Improve Drainage Characteristics - Progress: Progressing toward goal  Goals will be updated until maximal potential achieved or discharge criteria met.  Discharge criteria: when goals achieved, discharge from hospital, MD decision/surgical intervention, no progress towards goals, refusal/missing three consecutive treatments without notification or medical reason.  GP     Cody Sims, Cody Sims 03/13/2014, 10:43 AM  03/13/2014  Cody Sims, [place] [phone]  (pager)

## 2014-03-14 LAB — WOUND CULTURE: SPECIAL REQUESTS: NORMAL

## 2014-03-14 LAB — CULTURE, ROUTINE-ABSCESS

## 2014-03-14 NOTE — Discharge Summary (Signed)
NAMShon Sims:  [place], Boysie                     ACCOUNT NO.:  [id number]  MEDICAL RECORD NO.:  [id number]  LOCATION:  [id number]                        FACILITY:  [facility]  PHYSICIAN:  Johnette AbrahamHarrill C Delan Ksiazek, MD    DATE OF BIRTH:  11-14-1980  DATE OF ADMISSION:  03/11/2014 DATE OF DISCHARGE:                              DISCHARGE SUMMARY   ADMITTING DIAGNOSIS:  Infection, cellulitis and abscess of right elbow.  DISCHARGE DIAGNOSIS:  Infection, cellulitis and abscess of right elbow.  PROCEDURES:  During this hospital stay are operating room March 11, 2014, with I and D of right elbow.  On March 12, 2014, his additional I and D of the elbow with placement of Penrose drain.  HOSPITAL COURSE:  The patient was taken to the operating room on March 11, 2014, for severe infection and abscess formation of the right elbow and this was drained.  There was copious amounts of pus present.  He was placed on IV vancomycin.  Initial cultures came back with some gram-negative rods and, therefore, Zosyn was used to cover a more broader spectrum.  On postoperative day 1, the wound was significantly better.  However, there was still moderate fluid accumulation posteriorly, therefore, an additional I and D and placement of posterior drain was then performed.  Furthermore, hydrotherapy saw the patient, removed the packing from the extremity on postoperative day 1 and started hydrotherapy.  The patient had difficulty with pain control issues.  However, as the infection became better, the patient was more tolerant of wound irrigations.  This was performed with hydrotherapy once daily and with the nursing staff an additional 2 times daily.  On the day of discharge, he was afebrile.  His wound was markedly improved with no evidence of purulent drainage.  His white count day prior to discharge was normalized.  Cultures came back.  Group G strep.  The patient wanted to go home and was felt medically stable for  discharge.  DISCHARGE INSTRUCTIONS:  He is instructed extensively on wound care.  He is to irrigate the wound, cover it several times a day.  He is also to work on his elbow range of motion.  He will be sent home with a prescription of Keflex 500 mg 1 p.o. q.i.d. for 7 days.  He is also given a prescription for Percocet 7.5 mg 40 tablets 1-2 p.o. q.4-6 hours p.r.n. for pain.  I will see him next week in the office to remove his drain.     Johnette AbrahamHarrill C Glennette Galster, MD     HCC/MEDQ  D:  03/14/2014  T:  03/14/2014  Job:  [id number]

## 2014-03-14 NOTE — Progress Notes (Signed)
Patient discharged. Per Dr. Izora Ribasoley, patient is to stop taking flexeril, valium, prednisone, meloxicam and neurontin. Received verbal order.   Lulamae Skorupski L. Roseanne RenoStewart, PharmD Clinical Pharmacy Resident Pager: [phone] 03/14/2014 11:20 AM

## 2014-03-14 NOTE — Progress Notes (Signed)
Discussed discharge summary with patient. Reviewed all medications with patient. Patient received Rx. Educated patient on wound care at home. Patient said brother would come up for education on wound care but no family member was present at time of discharge. Patient left once I went over discharge instructions and stated that he understood everything to do at home.

## 2014-03-16 LAB — ANAEROBIC CULTURE

## 2014-03-22 ENCOUNTER — Other Ambulatory Visit: Payer: Self-pay | Admitting: Family Medicine

## 2018-07-20 NOTE — Congregational Nurse Program (Signed)
Well visit, no physical  concerns voiced  Today, no signs of distress observed today.

## 2019-06-14 ENCOUNTER — Emergency Department (HOSPITAL_COMMUNITY)
Admission: EM | Admit: 2019-06-14 | Discharge: 2019-06-15 | Discharge status: Against Medical Advice | Payer: Self-pay | Attending: Emergency Medicine | Admitting: Emergency Medicine

## 2019-06-14 ENCOUNTER — Other Ambulatory Visit: Payer: Self-pay

## 2019-06-14 ENCOUNTER — Encounter (HOSPITAL_COMMUNITY): Payer: Self-pay

## 2019-06-14 DIAGNOSIS — N3001 Acute cystitis with hematuria: Secondary | ICD-10-CM | POA: Insufficient documentation

## 2019-06-14 DIAGNOSIS — F17210 Nicotine dependence, cigarettes, uncomplicated: Secondary | ICD-10-CM | POA: Insufficient documentation

## 2019-06-14 DIAGNOSIS — Z5320 Procedure and treatment not carried out because of patient's decision for unspecified reasons: Secondary | ICD-10-CM | POA: Insufficient documentation

## 2019-06-14 DIAGNOSIS — N4829 Other inflammatory disorders of penis: Secondary | ICD-10-CM | POA: Insufficient documentation

## 2019-06-14 NOTE — ED Triage Notes (Signed)
Pt arrives to ED w/ c/o dysuria. Pt states he has not been able to urinate since yesterday, states he is unable to void b/c it is too painful. Pt states he is concerned he has a kidney stone and endorses flank/back pain that 2 days ago.

## 2019-06-15 LAB — URINALYSIS, ROUTINE W REFLEX MICROSCOPIC
Bilirubin Urine: NEGATIVE
Glucose, UA: NEGATIVE mg/dL
Ketones, ur: NEGATIVE mg/dL
Nitrite: POSITIVE — AB
Protein, ur: 30 mg/dL — AB
Specific Gravity, Urine: 1.026 (ref 1.005–1.030)
pH: 5 (ref 5.0–8.0)

## 2019-06-15 LAB — BASIC METABOLIC PANEL
Anion gap: 11 (ref 5–15)
BUN: 10 mg/dL (ref 6–20)
CO2: 25 mmol/L (ref 22–32)
Calcium: 9.2 mg/dL (ref 8.9–10.3)
Chloride: 105 mmol/L (ref 98–111)
Creatinine, Ser: 1.02 mg/dL (ref 0.61–1.24)
GFR calc Af Amer: >60 mL/min (ref >60)
GFR calc non Af Amer: >60 mL/min (ref >60)
Glucose, Bld: 114 mg/dL — ABNORMAL HIGH (ref 70–99)
Potassium: 4 mmol/L (ref 3.5–5.1)
Sodium: 141 mmol/L (ref 135–145)

## 2019-06-15 LAB — CBC WITH DIFFERENTIAL/PLATELET
Abs Immature Granulocytes: 0.03 10*3/uL (ref 0.00–0.07)
Basophils Absolute: 0 10*3/uL (ref 0.0–0.1)
Basophils Relative: 0 %
Eosinophils Absolute: 0.1 10*3/uL (ref 0.0–0.5)
Eosinophils Relative: 1 %
HCT: 41.3 % (ref 39.0–52.0)
Hemoglobin: 14 g/dL (ref 13.0–17.0)
Immature Granulocytes: 0 %
Lymphocytes Relative: 30 %
Lymphs Abs: 3.4 10*3/uL (ref 0.7–4.0)
MCH: 30.8 pg (ref 26.0–34.0)
MCHC: 33.9 g/dL (ref 30.0–36.0)
MCV: 91 fL (ref 80.0–100.0)
Monocytes Absolute: 0.8 10*3/uL (ref 0.1–1.0)
Monocytes Relative: 7 %
Neutro Abs: 7.1 10*3/uL (ref 1.7–7.7)
Neutrophils Relative %: 62 %
Platelets: 258 10*3/uL (ref 150–400)
RBC: 4.54 MIL/uL (ref 4.22–5.81)
RDW: 13 % (ref 11.5–15.5)
WBC: 11.5 10*3/uL — ABNORMAL HIGH (ref 4.0–10.5)
nRBC: 0 % (ref 0.0–0.2)

## 2019-06-15 MED ORDER — CEPHALEXIN 500 MG PO CAPS: 500.0000 mg | ORAL_CAPSULE | Freq: Three times a day (TID) | ORAL | 0 refills | Status: AC

## 2019-06-15 MED ORDER — HYDROCODONE-ACETAMINOPHEN 5-325 MG PO TABS
1.0000 | ORAL_TABLET | Freq: Once | ORAL | Status: DC
  Filled 2019-06-15: qty 1

## 2019-06-15 NOTE — ED Notes (Signed)
Pt requesting to leave AMA r/t daughter at home and becoming anxious about pt condition. Discussed risks of leaving AMA including serious bodily arm and/or death. Pt still wanting to leave despite risks. Vitals assessed and pt signed out AMA.

## 2019-06-15 NOTE — ED Provider Notes (Signed)
[facility] EMERGENCY DEPARTMENT Provider Note   CSN: [id number] Arrival date & time: 06/14/19  2329     History Chief Complaint  Patient presents with  . Dysuria    Cody Sims. is a 39 y.o. male.  The history is provided by the patient.  Dysuria Presenting symptoms: dysuria and penile pain   Presenting symptoms: no penile discharge   Relieved by:  Nothing Worsened by:  Urination Associated symptoms: urinary retention   Associated symptoms: no abdominal pain, no fever, no flank pain, no genital lesions and no vomiting   Risk factors: no kidney stones, does not have multiple sexual partners, no new sexual partner, no recent infection and not currently sexually active   Patient reports difficulty to urinate over the past 24 hours He reports every time he tries to urinate it hurts.  He reports he has not passed urine in 24 hours.  He feels anxious.  No fevers or vomiting.  No abdominal or back pain. He is in a monogamous relationship.  No recent sexual activity.  No penile discharge.  He has never had this before.  No history of urologic surgery     Past Medical History:  Diagnosis Date  . Chronic pain     Patient Active Problem List   Diagnosis Date Noted  . Cellulitis of elbow 03/11/2014  . Alcohol abuse with intoxication (HCC) 09/23/2013  . Anxiety attack 12/06/2012  . Chronic pain syndrome 08/11/2012  . DDD (degenerative disc disease), cervical 08/11/2012  . Carpal tunnel syndrome 08/11/2012  . Encounter for long-term (current) use of other medications 08/11/2012    Past Surgical History:  Procedure Laterality Date  . I & D EXTREMITY Right 03/11/2014   Procedure: IRRIGATION AND DEBRIDEMENT EXTREMITY;  Surgeon: Knute Neu, MD;  Location: [facility] OR;  Service: Plastics;  Laterality: Right;       Family History  Problem Relation Age of Onset  . Arthritis Mother        cervical DDD req anterior fusion  . Arthritis Father     Social History    Tobacco Use  . Smoking status: Current Every Day Smoker    Packs/day: 1.00    Types: Cigarettes  Substance Use Topics  . Alcohol use: No  . Drug use: No    Home Medications Prior to Admission medications   Medication Sig Start Date End Date Taking? Authorizing Provider  DULoxetine (CYMBALTA) 60 MG capsule Take 1 capsule (60 mg total) by mouth daily. Patient not taking: Reported on 03/11/2014 07/12/13   Copland, Gwenlyn Found, MD  ibuprofen (ADVIL,MOTRIN) 400 MG tablet Take 400 mg by mouth every 6 (six) hours as needed for mild pain or moderate pain.    [provider]    Allergies    Patient has no known allergies.  Review of Systems   Review of Systems  Constitutional: Negative for fever.  Gastrointestinal: Negative for abdominal pain and vomiting.  Genitourinary: Positive for dysuria and penile pain. Negative for discharge and flank pain.  All other systems reviewed and are negative.   Physical Exam Updated Vital Signs BP (!) 154/100 (BP Location: Left Arm)   Pulse 94   Temp 99 F (37.2 C) (Oral)   Resp 11   SpO2 99%   Physical Exam CONSTITUTIONAL: Well developed/well nourished, anxious HEAD: Normocephalic/atraumatic EYES: EOMI/PERRL ENMT: Mucous membranes moist NECK: supple no meningeal signs SPINE/BACK:entire spine nontender CV: S1/S2 noted, no murmurs/rubs/gallops noted LUNGS: Lungs are clear to auscultation bilaterally, no  apparent distress ABDOMEN: soft, nontender, no rebound or guarding, bowel sounds noted throughout abdomen GU:no cva tenderness No scrotal tenderness, no penile lesions, normal external genitalia Rectal-no prostate tenderness or mass, no prostatic enlargement, no blood or melena, nurse present for exam NEURO: Pt is awake/alert/appropriate, moves all extremitiesx4.  No facial droop.   EXTREMITIES: pulses normal/equal, full ROM SKIN: warm, color normal PSYCH: no abnormalities of mood noted, alert and oriented to situation  ED  Results / Procedures / Treatments   Labs (all labs ordered are listed, but only abnormal results are displayed) Labs Reviewed  URINALYSIS, ROUTINE W REFLEX MICROSCOPIC - Abnormal; Notable for the following components:      Result Value   APPearance HAZY (*)    Hgb urine dipstick MODERATE (*)    Protein, ur 30 (*)    Nitrite POSITIVE (*)    Leukocytes,Ua TRACE (*)    Bacteria, UA RARE (*)    All other components within normal limits  BASIC METABOLIC PANEL - Abnormal; Notable for the following components:   Glucose, Bld 114 (*)    All other components within normal limits  CBC WITH DIFFERENTIAL/PLATELET - Abnormal; Notable for the following components:   WBC 11.5 (*)    All other components within normal limits  URINE CULTURE    EKG None  Radiology No results found.  Procedures Procedures    Medications Ordered in ED Medications  HYDROcodone-acetaminophen (NORCO/VICODIN) 5-325 MG per tablet 1 tablet (1 tablet Oral Refused 06/15/19 0324)    ED Course  I have reviewed the triage vital signs and the nursing notes.  Pertinent labs   results that were available during my care of the patient were reviewed by me and considered in my medical decision making (see chart for details).    MDM Rules/Calculators/A&P                      Patient presented for difficulty urinating.  He was unable to pass urine on his own, therefore nursing did a in and out catheter.  He had approximately 500 mL of urine.  He felt improved afterwards.  Urine is consistent with UTI.  However prior to giving  him his information, patient had to leave to take care of his daughter 5:17 AM I just spoke to patient via phone.  He is feeling improved.  I have sent in an antibiotic Keflex 3 times daily for the next week.  I advised him to follow-up with [facility] in the next several weeks as unprovoked UTI in an otherwise healthy male was unusual If he is unable to pass urine over the next several hours he  will return to the ER for a Foley catheter. Final Clinical Impression(s) / ED Diagnoses Final diagnoses:  Acute cystitis with hematuria    Rx / DC Orders ED Discharge Orders         Ordered    cephALEXin (KEFLEX) 500 MG capsule  3 times daily     06/15/19 5732           Ripley Fraise, MD 06/15/19 [phone]

## 2019-06-16 LAB — URINE CULTURE: Culture: >=100000 — AB

## 2019-06-17 ENCOUNTER — Telehealth (HOSPITAL_BASED_OUTPATIENT_CLINIC_OR_DEPARTMENT_OTHER): Payer: Self-pay | Admitting: Emergency Medicine

## 2019-06-17 NOTE — Telephone Encounter (Signed)
Post ED Visit - Positive Culture Follow-up: Successful Patient Follow-Up  Culture assessed and recommendations reviewed by:  []  , Pharm.D. []  Enzo Bi, Pharm.D., BCPS AQ-ID []  , Pharm.D., BCPS []  Celedonio Miyamoto, Pharm.D., BCPS []  [place], Garvin Fila.D., BCPS, AAHIVP []  , Pharm.D., BCPS, AAHIVP []  Georgina Pillion, PharmD, BCPS []  , PharmD, BCPS []  [place], PharmD, BCPS [x]  [address], PharmD  Positive urine culture  []  Patient discharged without antimicrobial prescription and treatment is now indicated [x]  Organism is resistant to prescribed ED discharge antimicrobial []  Patient with positive blood cultures  Changes discussed with ED provider: PA New antibiotic prescription: Bactrim DS one tab PO BID x seven days Called to [facility] [address]) [phone]  Contacted patient, date 06/17/2019, time 1500   Xariah Silvernail C Gretchen Weinfeld 06/17/2019, 4:12 PM

## 2019-06-29 ENCOUNTER — Emergency Department (HOSPITAL_COMMUNITY): Admission: EM | Admit: 2019-06-29 | Discharge: 2019-06-29 | Discharge status: Against Medical Advice | Payer: Self-pay
